# Patient Record
Sex: Female | Born: 1980 | Race: White | Hispanic: No | Smoking: Never smoker
Health system: Southern US, Community
[De-identification: ages and names within clinical notes are randomized; demographics above are authoritative.]

## PROBLEM LIST (undated history)

## (undated) DIAGNOSIS — N200 Calculus of kidney: Secondary | ICD-10-CM

## (undated) DIAGNOSIS — D6851 Activated protein C resistance: Secondary | ICD-10-CM

## (undated) DIAGNOSIS — Z973 Presence of spectacles and contact lenses: Secondary | ICD-10-CM

## (undated) DIAGNOSIS — N201 Calculus of ureter: Secondary | ICD-10-CM

## (undated) DIAGNOSIS — I10 Essential (primary) hypertension: Secondary | ICD-10-CM

## (undated) DIAGNOSIS — J45909 Unspecified asthma, uncomplicated: Secondary | ICD-10-CM

## (undated) DIAGNOSIS — Z87442 Personal history of urinary calculi: Secondary | ICD-10-CM

## (undated) DIAGNOSIS — R319 Hematuria, unspecified: Secondary | ICD-10-CM

## (undated) HISTORY — PX: DILATION AND CURETTAGE OF UTERUS: SHX78

## (undated) HISTORY — DX: Activated protein C resistance: D68.51

## (undated) HISTORY — PX: ESSURE TUBAL LIGATION: SUR464

---
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
## 2000-10-03 ENCOUNTER — Encounter: Payer: Self-pay | Admitting: Emergency Medicine

## 2000-10-03 ENCOUNTER — Emergency Department (HOSPITAL_COMMUNITY): Admission: EM | Admit: 2000-10-03 | Discharge: 2000-10-03 | Payer: Self-pay | Admitting: Emergency Medicine

## 2001-03-10 ENCOUNTER — Emergency Department (HOSPITAL_COMMUNITY): Admission: EM | Admit: 2001-03-10 | Discharge: 2001-03-10 | Payer: Self-pay | Admitting: Emergency Medicine

## 2001-03-10 ENCOUNTER — Observation Stay (HOSPITAL_COMMUNITY): Admission: EM | Admit: 2001-03-10 | Discharge: 2001-03-11 | Payer: Self-pay | Admitting: Emergency Medicine

## 2001-03-10 ENCOUNTER — Encounter: Payer: Self-pay | Admitting: Emergency Medicine

## 2002-04-01 ENCOUNTER — Encounter: Payer: Self-pay | Admitting: Emergency Medicine

## 2002-04-01 ENCOUNTER — Emergency Department (HOSPITAL_COMMUNITY): Admission: EM | Admit: 2002-04-01 | Discharge: 2002-04-01 | Payer: Self-pay | Admitting: Emergency Medicine

## 2004-04-19 ENCOUNTER — Observation Stay (HOSPITAL_COMMUNITY): Admission: EM | Admit: 2004-04-19 | Discharge: 2004-04-20 | Payer: Self-pay | Admitting: Emergency Medicine

## 2004-05-24 ENCOUNTER — Emergency Department (HOSPITAL_COMMUNITY): Admission: EM | Admit: 2004-05-24 | Discharge: 2004-05-24 | Payer: Self-pay | Admitting: Emergency Medicine

## 2004-08-31 ENCOUNTER — Inpatient Hospital Stay (HOSPITAL_COMMUNITY): Admission: AD | Admit: 2004-08-31 | Discharge: 2004-08-31 | Payer: Self-pay | Admitting: Obstetrics and Gynecology

## 2004-09-15 ENCOUNTER — Inpatient Hospital Stay (HOSPITAL_COMMUNITY): Admission: AD | Admit: 2004-09-15 | Discharge: 2004-09-15 | Payer: Self-pay | Admitting: Obstetrics and Gynecology

## 2004-10-13 ENCOUNTER — Inpatient Hospital Stay (HOSPITAL_COMMUNITY): Admission: AD | Admit: 2004-10-13 | Discharge: 2004-10-13 | Payer: Self-pay | Admitting: Obstetrics & Gynecology

## 2004-10-13 ENCOUNTER — Emergency Department (HOSPITAL_COMMUNITY): Admission: EM | Admit: 2004-10-13 | Discharge: 2004-10-13 | Payer: Self-pay | Admitting: Emergency Medicine

## 2004-12-03 ENCOUNTER — Inpatient Hospital Stay (HOSPITAL_COMMUNITY): Admission: AD | Admit: 2004-12-03 | Discharge: 2004-12-03 | Payer: Self-pay | Admitting: Obstetrics and Gynecology

## 2004-12-04 ENCOUNTER — Inpatient Hospital Stay (HOSPITAL_COMMUNITY): Admission: AD | Admit: 2004-12-04 | Discharge: 2004-12-06 | Payer: Self-pay | Admitting: Obstetrics and Gynecology

## 2004-12-27 ENCOUNTER — Inpatient Hospital Stay (HOSPITAL_COMMUNITY): Admission: AD | Admit: 2004-12-27 | Discharge: 2004-12-31 | Payer: Self-pay | Admitting: Obstetrics and Gynecology

## 2005-03-03 ENCOUNTER — Ambulatory Visit (HOSPITAL_COMMUNITY): Admission: AD | Admit: 2005-03-03 | Discharge: 2005-03-03 | Payer: Self-pay | Admitting: Obstetrics and Gynecology

## 2005-04-13 ENCOUNTER — Inpatient Hospital Stay (HOSPITAL_COMMUNITY): Admission: AD | Admit: 2005-04-13 | Discharge: 2005-04-13 | Payer: Self-pay | Admitting: Obstetrics and Gynecology

## 2005-04-16 ENCOUNTER — Inpatient Hospital Stay (HOSPITAL_COMMUNITY): Admission: AD | Admit: 2005-04-16 | Discharge: 2005-04-17 | Payer: Self-pay | Admitting: Obstetrics and Gynecology

## 2005-04-22 ENCOUNTER — Inpatient Hospital Stay (HOSPITAL_COMMUNITY): Admission: AD | Admit: 2005-04-22 | Discharge: 2005-04-24 | Payer: Self-pay | Admitting: Obstetrics and Gynecology

## 2005-06-06 ENCOUNTER — Other Ambulatory Visit: Admission: RE | Admit: 2005-06-06 | Discharge: 2005-06-06 | Payer: Self-pay | Admitting: Obstetrics and Gynecology

## 2005-11-26 ENCOUNTER — Emergency Department (HOSPITAL_COMMUNITY): Admission: EM | Admit: 2005-11-26 | Discharge: 2005-11-26 | Payer: Self-pay | Admitting: Emergency Medicine

## 2007-09-13 ENCOUNTER — Emergency Department (HOSPITAL_COMMUNITY): Admission: EM | Admit: 2007-09-13 | Discharge: 2007-09-13 | Payer: Self-pay | Admitting: Emergency Medicine

## 2008-01-27 ENCOUNTER — Ambulatory Visit (HOSPITAL_COMMUNITY): Admission: RE | Admit: 2008-01-27 | Discharge: 2008-01-27 | Payer: Self-pay | Admitting: Obstetrics and Gynecology

## 2008-02-09 ENCOUNTER — Ambulatory Visit (HOSPITAL_COMMUNITY): Admission: RE | Admit: 2008-02-09 | Discharge: 2008-02-09 | Payer: Self-pay | Admitting: Obstetrics and Gynecology

## 2008-02-09 ENCOUNTER — Encounter (INDEPENDENT_AMBULATORY_CARE_PROVIDER_SITE_OTHER): Payer: Self-pay | Admitting: Obstetrics and Gynecology

## 2008-11-02 ENCOUNTER — Ambulatory Visit: Payer: Self-pay | Admitting: Oncology

## 2008-11-09 LAB — CBC WITH DIFFERENTIAL (CANCER CENTER ONLY)
Eosinophils Absolute: 0.2 10*3/uL (ref 0.0–0.5)
HCT: 36.6 % (ref 34.8–46.6)
LYMPH%: 34.3 % (ref 14.0–48.0)
MCH: 29.4 pg (ref 26.0–34.0)
MCV: 83 fL (ref 81–101)
MONO#: 0.7 10*3/uL (ref 0.1–0.9)
MONO%: 7.4 % (ref 0.0–13.0)
NEUT%: 55 % (ref 39.6–80.0)
Platelets: 296 10*3/uL (ref 145–400)
RBC: 4.39 10*6/uL (ref 3.70–5.32)
WBC: 9.1 10*3/uL (ref 3.9–10.0)

## 2008-11-09 LAB — CMP (CANCER CENTER ONLY)
ALT(SGPT): 24 U/L (ref 10–47)
AST: 18 U/L (ref 11–38)
Albumin: 3.5 g/dL (ref 3.3–5.5)
Alkaline Phosphatase: 75 U/L (ref 26–84)
Potassium: 3.5 mEq/L (ref 3.3–4.7)
Sodium: 139 mEq/L (ref 128–145)
Total Protein: 7.5 g/dL (ref 6.4–8.1)

## 2008-11-15 LAB — HYPERCOAGULABLE PANEL, COMPREHENSIVE RET.
Beta-2 Glyco I IgG: 13 U/mL (ref <20)
Homocysteine: 6.5 umol/L (ref 4.0–15.4)
Protein C Activity: 133 % (ref 75–133)
Protein C, Total: 83 % (ref 70–140)
Protein S Ag, Total: 95 % (ref 70–140)

## 2008-11-17 LAB — CBC WITH DIFFERENTIAL (CANCER CENTER ONLY)
BASO#: 0.1 10*3/uL (ref 0.0–0.2)
BASO%: 0.7 % (ref 0.0–2.0)
EOS%: 1.6 % (ref 0.0–7.0)
Eosinophils Absolute: 0.2 10*3/uL (ref 0.0–0.5)
HCT: 37.9 % (ref 34.8–46.6)
HGB: 13.4 g/dL (ref 11.6–15.9)
LYMPH#: 2.8 10*3/uL (ref 0.9–3.3)
LYMPH%: 28 % (ref 14.0–48.0)
MCH: 29.5 pg (ref 26.0–34.0)
MCHC: 35.3 g/dL (ref 32.0–36.0)
MCV: 83 fL (ref 81–101)
MONO#: 0.7 10*3/uL (ref 0.1–0.9)
MONO%: 6.9 % (ref 0.0–13.0)
NEUT#: 6.3 10*3/uL (ref 1.5–6.5)
NEUT%: 62.8 % (ref 39.6–80.0)
Platelets: 266 10*3/uL (ref 145–400)
RBC: 4.54 10*6/uL (ref 3.70–5.32)
RDW: 13 % (ref 10.5–14.6)
WBC: 10 10*3/uL (ref 3.9–10.0)

## 2008-11-17 LAB — PROTIME-INR (CHCC SATELLITE)
INR: 1 — ABNORMAL LOW (ref 2.0–3.5)
Protime: 12 Seconds (ref 10.6–13.4)

## 2008-11-18 LAB — APTT: aPTT: 29 seconds (ref 24–37)

## 2008-11-25 ENCOUNTER — Inpatient Hospital Stay (HOSPITAL_COMMUNITY): Admission: AD | Admit: 2008-11-25 | Discharge: 2008-11-25 | Payer: Self-pay | Admitting: Obstetrics and Gynecology

## 2008-12-20 ENCOUNTER — Ambulatory Visit: Payer: Self-pay | Admitting: Oncology

## 2008-12-22 LAB — CBC WITH DIFFERENTIAL (CANCER CENTER ONLY)
BASO#: 0.1 10*3/uL (ref 0.0–0.2)
Eosinophils Absolute: 0.2 10*3/uL (ref 0.0–0.5)
HGB: 12.3 g/dL (ref 11.6–15.9)
LYMPH#: 2.3 10*3/uL (ref 0.9–3.3)
MONO#: 0.7 10*3/uL (ref 0.1–0.9)
NEUT#: 5.3 10*3/uL (ref 1.5–6.5)
Platelets: 246 10*3/uL (ref 145–400)
RBC: 4.2 10*6/uL (ref 3.70–5.32)
WBC: 8.5 10*3/uL (ref 3.9–10.0)

## 2009-01-19 ENCOUNTER — Inpatient Hospital Stay (HOSPITAL_COMMUNITY): Admission: AD | Admit: 2009-01-19 | Discharge: 2009-01-20 | Payer: Self-pay | Admitting: Obstetrics and Gynecology

## 2009-03-16 ENCOUNTER — Ambulatory Visit: Payer: Self-pay | Admitting: Oncology

## 2009-03-17 LAB — CBC WITH DIFFERENTIAL (CANCER CENTER ONLY)
BASO%: 0.9 % (ref 0.0–2.0)
EOS%: 1.3 % (ref 0.0–7.0)
HCT: 32.4 % — ABNORMAL LOW (ref 34.8–46.6)
LYMPH#: 2.1 10*3/uL (ref 0.9–3.3)
MCH: 30 pg (ref 26.0–34.0)
MCHC: 34.3 g/dL (ref 32.0–36.0)
MONO%: 5.5 % (ref 0.0–13.0)
NEUT%: 72 % (ref 39.6–80.0)
RDW: 13 % (ref 10.5–14.6)

## 2009-03-17 LAB — PROTIME-INR (CHCC SATELLITE)
INR: 0.9 — ABNORMAL LOW (ref 2.0–3.5)
Protime: 10.8 Seconds (ref 10.6–13.4)

## 2009-03-17 LAB — HEPARIN ANTI-XA: Heparin LMW: <0.1 IU/mL

## 2009-03-22 LAB — URINALYSIS, MICROSCOPIC (CHCC SATELLITE)
Bilirubin (Urine): NEGATIVE
Glucose: NEGATIVE g/dL

## 2009-03-22 LAB — CBC WITH DIFFERENTIAL (CANCER CENTER ONLY)
BASO#: 0.1 10*3/uL (ref 0.0–0.2)
BASO%: 0.7 % (ref 0.0–2.0)
EOS%: 1.8 % (ref 0.0–7.0)
HGB: 10.6 g/dL — ABNORMAL LOW (ref 11.6–15.9)
LYMPH#: 2.4 10*3/uL (ref 0.9–3.3)
MCH: 29.8 pg (ref 26.0–34.0)
MCHC: 34.2 g/dL (ref 32.0–36.0)
MONO%: 5.9 % (ref 0.0–13.0)
NEUT#: 7 10*3/uL — ABNORMAL HIGH (ref 1.5–6.5)
Platelets: 226 10*3/uL (ref 145–400)
RDW: 13.1 % (ref 10.5–14.6)

## 2009-03-22 LAB — IRON AND TIBC: %SAT: 20 % (ref 20–55)

## 2009-03-28 LAB — PROTIME-INR (CHCC SATELLITE)

## 2009-05-01 ENCOUNTER — Inpatient Hospital Stay (HOSPITAL_COMMUNITY): Admission: AD | Admit: 2009-05-01 | Discharge: 2009-05-01 | Payer: Self-pay | Admitting: Obstetrics & Gynecology

## 2009-05-22 ENCOUNTER — Ambulatory Visit: Payer: Self-pay | Admitting: Oncology

## 2009-06-15 ENCOUNTER — Ambulatory Visit: Payer: Self-pay | Admitting: Oncology

## 2009-06-20 LAB — CBC WITH DIFFERENTIAL (CANCER CENTER ONLY)
BASO%: 0.5 % (ref 0.0–2.0)
LYMPH#: 2.1 10*3/uL (ref 0.9–3.3)
LYMPH%: 26.4 % (ref 14.0–48.0)
MCHC: 34 g/dL (ref 32.0–36.0)
MCV: 86 fL (ref 81–101)
MONO#: 0.5 10*3/uL (ref 0.1–0.9)
NEUT%: 65.4 % (ref 39.6–80.0)
RBC: 3.63 10*6/uL — ABNORMAL LOW (ref 3.70–5.32)
RDW: 13.2 % (ref 10.5–14.6)

## 2009-06-20 LAB — HEPARIN ANTI-XA: Heparin LMW: 0.32 IU/mL

## 2009-06-29 ENCOUNTER — Inpatient Hospital Stay (HOSPITAL_COMMUNITY): Admission: AD | Admit: 2009-06-29 | Discharge: 2009-07-02 | Payer: Self-pay | Admitting: Obstetrics

## 2009-07-06 LAB — CBC WITH DIFFERENTIAL (CANCER CENTER ONLY)
BASO%: 0.5 % (ref 0.0–2.0)
EOS%: 2.7 % (ref 0.0–7.0)
LYMPH#: 2 10*3/uL (ref 0.9–3.3)
LYMPH%: 31.7 % (ref 14.0–48.0)
MCV: 86 fL (ref 81–101)
NEUT%: 59.1 % (ref 39.6–80.0)
WBC: 6.3 10*3/uL (ref 3.9–10.0)

## 2009-07-06 LAB — PROTIME-INR (CHCC SATELLITE)
INR: 0.9 — ABNORMAL LOW (ref 2.0–3.5)
Protime: 10.8 Seconds (ref 10.6–13.4)

## 2009-07-10 ENCOUNTER — Ambulatory Visit: Payer: Self-pay | Admitting: Oncology

## 2009-07-10 LAB — CBC WITH DIFFERENTIAL/PLATELET
Basophils Absolute: 0 10*3/uL (ref 0.0–0.1)
Eosinophils Absolute: 0.2 10*3/uL (ref 0.0–0.5)
HGB: 13.4 g/dL (ref 11.6–15.9)
MCH: 28.6 pg (ref 25.1–34.0)
MCHC: 33.3 g/dL (ref 31.5–36.0)
RBC: 4.69 10*6/uL (ref 3.70–5.45)
RDW: 14.2 % (ref 11.2–14.5)
lymph#: 2.4 10*3/uL (ref 0.9–3.3)

## 2009-07-10 LAB — PROTIME-INR
INR: 1.5 — ABNORMAL LOW (ref 2.00–3.50)
Protime: 18 Seconds — ABNORMAL HIGH (ref 10.6–13.4)

## 2009-07-11 ENCOUNTER — Ambulatory Visit: Payer: Self-pay | Admitting: Oncology

## 2009-07-14 LAB — PROTIME-INR (CHCC SATELLITE): INR: 2.7 (ref 2.0–3.5)

## 2009-07-21 LAB — PROTIME-INR (CHCC SATELLITE): INR: 3.5 (ref 2.0–3.5)

## 2009-07-28 LAB — PROTIME-INR (CHCC SATELLITE): Protime: 34.8 Seconds — ABNORMAL HIGH (ref 10.6–13.4)

## 2009-08-03 LAB — CBC WITH DIFFERENTIAL (CANCER CENTER ONLY)
BASO%: 0.8 % (ref 0.0–2.0)
HGB: 13.1 g/dL (ref 11.6–15.9)
LYMPH#: 2.6 10*3/uL (ref 0.9–3.3)
MCH: 28.6 pg (ref 26.0–34.0)
MONO#: 0.4 10*3/uL (ref 0.1–0.9)
NEUT#: 4.4 10*3/uL (ref 1.5–6.5)
NEUT%: 57.3 % (ref 39.6–80.0)
WBC: 7.6 10*3/uL (ref 3.9–10.0)

## 2009-08-03 LAB — BASIC METABOLIC PANEL - CANCER CENTER ONLY
BUN, Bld: 12 mg/dL (ref 7–22)
Chloride: 108 mEq/L (ref 98–108)
Creat: 0.6 mg/dl (ref 0.6–1.2)
Glucose, Bld: 86 mg/dL (ref 73–118)

## 2009-08-03 LAB — PROTIME-INR (CHCC SATELLITE): Protime: 14.4 Seconds — ABNORMAL HIGH (ref 10.6–13.4)

## 2009-09-11 ENCOUNTER — Ambulatory Visit (HOSPITAL_COMMUNITY): Admission: RE | Admit: 2009-09-11 | Discharge: 2009-09-11 | Payer: Self-pay | Admitting: Obstetrics and Gynecology

## 2010-03-02 ENCOUNTER — Ambulatory Visit: Payer: Self-pay | Admitting: Oncology

## 2010-06-03 ENCOUNTER — Encounter: Payer: Self-pay | Admitting: Obstetrics and Gynecology

## 2010-07-31 LAB — BASIC METABOLIC PANEL
BUN: 13 mg/dL (ref 6–23)
CO2: 25 mEq/L (ref 19–32)
Chloride: 104 mEq/L (ref 96–112)
Glucose, Bld: 86 mg/dL (ref 70–99)

## 2010-07-31 LAB — CBC
MCV: 86.9 fL (ref 78.0–100.0)
RBC: 4.61 MIL/uL (ref 3.87–5.11)
RDW: 14.1 % (ref 11.5–15.5)

## 2010-08-01 LAB — CBC
Hemoglobin: 11.1 g/dL — ABNORMAL LOW (ref 12.0–15.0)
MCHC: 33.6 g/dL (ref 30.0–36.0)
RBC: 3.86 MIL/uL — ABNORMAL LOW (ref 3.87–5.11)
RDW: 15.9 % — ABNORMAL HIGH (ref 11.5–15.5)
WBC: 9.8 10*3/uL (ref 4.0–10.5)

## 2010-08-01 LAB — RPR: RPR Ser Ql: NONREACTIVE

## 2010-08-13 LAB — URINALYSIS, ROUTINE W REFLEX MICROSCOPIC
Bilirubin Urine: NEGATIVE
Hgb urine dipstick: NEGATIVE
Ketones, ur: NEGATIVE mg/dL
Protein, ur: NEGATIVE mg/dL
Specific Gravity, Urine: 1.01 (ref 1.005–1.030)
Urobilinogen, UA: 0.2 mg/dL (ref 0.0–1.0)

## 2010-08-13 LAB — URINE MICROSCOPIC-ADD ON: RBC / HPF: NONE SEEN RBC/hpf (ref <3)

## 2010-08-13 LAB — URINE CULTURE: Colony Count: NO GROWTH

## 2010-08-17 LAB — URINALYSIS, ROUTINE W REFLEX MICROSCOPIC
Glucose, UA: NEGATIVE mg/dL
Hgb urine dipstick: NEGATIVE
Protein, ur: NEGATIVE mg/dL
pH: 6.5 (ref 5.0–8.0)

## 2010-08-19 LAB — CBC
Hemoglobin: 12.2 g/dL (ref 12.0–15.0)
MCV: 87.6 fL (ref 78.0–100.0)
Platelets: 232 10*3/uL (ref 150–400)
RBC: 4.06 MIL/uL (ref 3.87–5.11)
RDW: 13.4 % (ref 11.5–15.5)
WBC: 10.2 10*3/uL (ref 4.0–10.5)

## 2010-08-19 LAB — PROTIME-INR: Prothrombin Time: 13.9 seconds (ref 11.6–15.2)

## 2010-09-05 ENCOUNTER — Other Ambulatory Visit: Payer: Self-pay | Admitting: Emergency Medicine

## 2010-09-05 DIAGNOSIS — E079 Disorder of thyroid, unspecified: Secondary | ICD-10-CM

## 2010-09-25 NOTE — Op Note (Signed)
NAMESHALICE, WOODRING               ACCOUNT NO.:  [id number]   MEDICAL RECORD NO.:  [id number]          PATIENT TYPE:  AMB   LOCATION:  SDC                           [facility]ILITY:  [facility]   PHYSICIAN:  Carrington Clamp, M.D. DATE OF BIRTH:  [birth year]   DATE OF PROCEDURE:  02/09/2008  DATE OF DISCHARGE:                               OPERATIVE REPORT   PREOPERATIVE DIAGNOSIS:  Missed abortion.   POSTOPERATIVE DIAGNOSIS:  Missed abortion.   PROCEDURE:  Dilation and evacuation.   SURGEON:  Carrington Clamp, MD   ANESTHESIA:  LMA   FINDINGS:  8 weeks' size uterus down to 6-week size post procedure with  good CRIE.   SPECIMENS:  Products of conception, uterine contents.   DISPOSITION:  To Pathology and for genetic studies.   ESTIMATED BLOOD LOSS:  50 mL.   IV FLUIDS:  1000 mL.   URINE OUTPUT:  Not measured.   COMPLICATIONS:  None.   MEDICATIONS:  Methergine.   COUNTS:  Correct x3.   TECHNIQUE:  After adequate general anesthesia was achieved, the patient  was prepped and draped in usual sterile fashion in the dorsal lithotomy  position.  Bladder was emptied with a red rubber catheter.  Speculum  placed in the vagina.  The cervix was grasped with tenaculum and then  dilated up with Shawnie Pons dilators.  A 10-mm suction curette was used to  evacuate the uterus and alternating the suction and sharp curettage was  performed until good CRIE was identified.  The products of conception  were sent to Pathology and for genetic studies as there had been an  abnormality of the baby's lungs with fluid in both lungs on her early  ultrasound at 8 weeks (about 3 weeks earlier from today).  All the  instruments were then withdrawn from the vagina and the patient was  given a dose of Methergine.  The patient tolerated the procedure well.  She returned to recovery room in stable condition.      Carrington Clamp, M.D.  Electronically Signed     MH/MEDQ  D:  02/09/2008  T:  02/10/2008  Job:   [id number]

## 2010-09-28 NOTE — Discharge Summary (Signed)
[name], [name]               ACCOUNT NO.:  [id number]   MEDICAL RECORD NO.:  [id number]          PATIENT TYPE:  INP   LOCATION:  9153                          [facility]ILITY:  [facility]   PHYSICIAN:  Randye Lobo, M.D.   DATE OF BIRTH:  [birth year]   DATE OF ADMISSION:  DATE OF DISCHARGE:  12/31/2004                                 DISCHARGE SUMMARY   ADMISSION DIAGNOSES:  1.  Intrauterine gestation at 22-6/7 weeks.  2.  Severe back pain.   DISCHARGE DIAGNOSES:  1.  Intrauterine gestation at 23-3/7, undelivered.  2.  Escherichia coli pyelonephritis, resolving.  3.  Hypokalemia, resolving.  4.  Anemia.   ADMISSION HISTORY AND PHYSICAL EXAMINATION:  The patient is a 30 year old  gravida 3, para 1, 0, 1, 1 female at 22-6/[redacted] weeks gestation who presented  with a complaint of severe back pain, vomiting, and leakage of fluid per  vagina.   The patient's antepartum course was significant for nausea and vomiting of  pregnancy. She also had a history of hypertension but was not currently  being treated with an antihypertensive medication. The patient also had a  history of gastroesophageal reflux disease. Of note, 3 weeks prior to the  current admission, the patient was treated for E-coli pyelonephritis  sensitive to Ancef.   On physical examination the patient was noted to have a temperature of  102.5, blood pressure 132/61. Back slight left CVA tenderness. Abdomen was  soft and nontender. Sterile speculum examination demonstrated no ferning and  the cervix was noted to be closed. Fetal heart rate tones were in the 190s  and the tocometer demonstrated no contractions. Admission, potassium 3. WBC  13,800, hematocrit 31.3%. Amylase 36, lipase 23.   Admission OB ultrasound: Estimated fetal weight 628 grams. Transverse lie.  AFI of 13.4. Cervix 5.5 cm of length.   The patient was given a diagnosis of probable pyelonephritis. A urinalysis  and culture was performed and the patient was begun  on Ancef 2 grams IV q.6  h. The patient was also prescribed Pepcid and Phenergan for her  gastroesophageal reflux disease.   HOSPITAL COURSE:  The patient's antibiotic regimen was expanded to include  gentamycin and clindamycin early in the morning on December 28, 2004.  Her  white blood cell count that morning was 15.7 thousand. The patient received  Stadol for pain and Tylenol for fever. She was given potassium chloride  intravenously to treat the hypokalemia. By December 30, 2004 the final culture  report demonstrated E. Coli 40,000 colonies per ml. The E. coli was  sensitive to cefazolin and gentamycin. The gentamicin was discontinued at  that time. Her white blood cell count was down to 8.9 thousand at the time  that the antibiotic order change was made.  Her hemoglobin was noted to be  8.4 on December 30, 2004.   CONDITION ON DISCHARGE:  By December 31, 2004 the patient's pain had resolved.  She had no more fevers. She was feeling well and ready for discharge.   DISPOSITION:  The patient is discharged to home in improved condition.  DISCHARGE MEDICATIONS:  1.  Keflex 500 mg p.o. q.i.d. times 7 days.  2.  After she completes the course of Keflex she will be switched to      Macrobid 100 mg p.o. q.h.s. for suppression of urinary tract infection      throughout the remainder of her pregnancy.  3.  Prenatal vitamins one p.o. daily.  4.  Iron sulfate 325 mg p.o. t.i.d.   FOLLOW UP:  The patient will follow up in the office in the office in 8  days.  The patient will call the office if she experiences fever, increased  pain, and nausea and vomiting, abdominal cramping or contractions, leakage  of fluid per vagina, decreased fetal movement, or any other concern.      Randye Lobo, M.D.  Electronically Signed     BES/MEDQ  D:  02/02/2005  T:  02/03/2005  Job:  [id number]

## 2010-09-28 NOTE — H&P (Signed)
NAMESHEWANDA, [name]               ACCOUNT NO.:  [id number]   MEDICAL RECORD NO.:  [id number]          PATIENT TYPE:  OIB   LOCATION:  A415                          [facility]ACILITY:  [facility]   PHYSICIAN:  Tilda Burrow, M.D. DATE OF BIRTH:  [birth year]   DATE OF ADMISSION:  03/03/2005  DATE OF DISCHARGE:  LH                                HISTORY & PHYSICAL   CHIEF COMPLAINT:  Vaginal bleeding at 32 weeks.   HISTORY OF PRESENT ILLNESS:  This 30 year old female, G3, P1, AB 1, one  living child, presents now at 41 weeks' gestation followed at [facility] with a pregnancy that has been notable for urinary tract infections  requiring chronic Macrodantin suppression at this time and early first-  trimester admission x3 for dehydration and vomiting complaints.  She is  evaluated at [facility] after presenting with episode of blood  noted in the toilet when she went to void.  She has sensation of some fluid  loss per vagina and upon check of the toilet bowl, she was found to have  significant amount of redness in the toilet bowl.  She has not had any  recent urinary frequency.  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   Monitor shows only occasional mild contraction consistent with Braxton-Hicks  contractions.  She is monitored for greater than an hour and only two  contractions noted documented on monitoring.  Fetal heart rate is extremely  reactive.  Limited ultrasound is performed by Dr. Emelda Fear revealing a  singleton BREECH infant, back down with generous fluid volume, anterior  fundal placenta with clear placental margins.  There is no evidence of  previa.  There is no placental bleeding appreciated.  The patient complains  of left-sided tenderness, but on ultrasound this appears to be due to the  positioning of the fetal feet and absence of placenta in that area.  She is  probably feeling the kicks more than anticipated.  Speculum  exam shows  physiologic secretions negative for blood.  Cervix is posterior, long and  high.   IMPRESSION:  Urinalysis is pending at this time.  No pregnancy-related  source of bleeding.  Await urinalysis.  At this time we have presumptive  diagnosis of bleeding from the possible subclinical urinary tract infection.   PLAN:  Will continue empiric Macrodantin therapy.  Send urine culture and  follow up through [facility] with results.      Tilda Burrow, M.D.  Electronically Signed    JVF/MEDQ  D:  03/03/2005  T:  03/04/2005  Job:  [id number]   cc:   [facility]   [facility]

## 2010-09-28 NOTE — Consult Note (Signed)
[name], [name]               ACCOUNT NO.:  [id number]   MEDICAL RECORD NO.:  [id number]          PATIENT TYPE:  OBV   LOCATION:  A418                          [facility]   PHYSICIAN:  Tilda Burrow, M.D. DATE OF BIRTH:  1980-11-25   DATE OF CONSULTATION:  DATE OF DISCHARGE:                                   CONSULTATION   CHIEF COMPLAINT:  Early pregnancy bleeding times two days.   HISTORY OF PRESENT ILLNESS:  This 30 year old, gravida 2, para 1, LMP  March 13, 2004, with diagnosis of pregnancy one week ago with home  pregnancy test confirmed with quantitative HCG one week ago at office of Dr.  Ralph Dowdy of [facility] presents with three days ago becoming mucoid vaginal discharge  followed by two days of light intermittent bleeding and dramatically  increased cramping this evening.  She presents to [facility] Emergency Room  with some subjective sense of dizziness but minimal blood loss.  She  presents with hemoglobin 13, hematocrit 40, quantitative HCG dropping to 8.  Blood type is Rh+.   PHYSICAL EXAMINATION:  GENERAL:  Normal-appearing, healthy, Caucasian female  in mild to moderate discomfort only, tolerating intermittent cramping  poorly.  Pain is central, mid-line with minimal radiation into left lower  quadrant.  GENITOURINARY:  External genitalia normal.  Vaginal exam shows light blood.  Prior exam by Dr. Weldon Inches, ER physician, indicates that a couple of small  clots were noted when he first examined her.  Uterus is anterior, upper  limits of normal size, 2+ tender.  Adnexa are specifically nontender on the  left or right.   IMPRESSION:  Missed AB.  Inevitable pregnancy loss.   PLAN:  Overnight observation for pain management.  We will give oral Cytotec  to increase cramping and increase the incidents of spontaneous expulsion of  POCs.  Probable brief overnight stay and home in a.m.     John   JVF/MEDQ  D:  04/19/2004  T:  04/20/2004  Job:  [id number]   cc:    Tilda Burrow, M.D.  [address] [place]  [place] [address]  Fax: [phone]   Ernestina Penna  [address]  [place]  [place] [address]  Fax: [phone]

## 2010-10-01 ENCOUNTER — Ambulatory Visit
Admission: RE | Admit: 2010-10-01 | Discharge: 2010-10-01 | Disposition: A | Discharge status: Home / Self Care | Payer: BC Managed Care – PPO | Source: Ambulatory Visit | Attending: Emergency Medicine | Admitting: Emergency Medicine

## 2010-10-01 DIAGNOSIS — E079 Disorder of thyroid, unspecified: Secondary | ICD-10-CM

## 2011-02-06 LAB — DIFFERENTIAL
Basophils Absolute: 0
Basophils Relative: 0
Eosinophils Absolute: 0.1
Monocytes Relative: 8
Neutro Abs: 6.6
Neutrophils Relative %: 71

## 2011-02-06 LAB — BASIC METABOLIC PANEL
BUN: 15
CO2: 28
Calcium: 9.5
Chloride: 105
Creatinine, Ser: 0.97
Glucose, Bld: 106 — ABNORMAL HIGH

## 2011-02-06 LAB — CBC
MCHC: 34.9
MCV: 84.8
Platelets: 236
RDW: 13

## 2011-02-09 ENCOUNTER — Emergency Department (HOSPITAL_COMMUNITY)
Admission: EM | Admit: 2011-02-09 | Discharge: 2011-02-09 | Disposition: A | Discharge status: Home / Self Care | Payer: BC Managed Care – PPO | Attending: Emergency Medicine | Admitting: Emergency Medicine

## 2011-02-09 ENCOUNTER — Emergency Department (HOSPITAL_COMMUNITY): Payer: BC Managed Care – PPO

## 2011-02-09 DIAGNOSIS — N898 Other specified noninflammatory disorders of vagina: Secondary | ICD-10-CM | POA: Insufficient documentation

## 2011-02-09 DIAGNOSIS — I10 Essential (primary) hypertension: Secondary | ICD-10-CM | POA: Insufficient documentation

## 2011-02-09 DIAGNOSIS — N390 Urinary tract infection, site not specified: Secondary | ICD-10-CM | POA: Insufficient documentation

## 2011-02-09 DIAGNOSIS — N201 Calculus of ureter: Secondary | ICD-10-CM | POA: Insufficient documentation

## 2011-02-09 DIAGNOSIS — R109 Unspecified abdominal pain: Secondary | ICD-10-CM | POA: Insufficient documentation

## 2011-02-09 DIAGNOSIS — D682 Hereditary deficiency of other clotting factors: Secondary | ICD-10-CM | POA: Insufficient documentation

## 2011-02-09 LAB — BASIC METABOLIC PANEL
BUN: 12 mg/dL (ref 6–23)
GFR calc Af Amer: >60 mL/min (ref >60)
GFR calc non Af Amer: >60 mL/min (ref >60)
Potassium: 3.4 mEq/L — ABNORMAL LOW (ref 3.5–5.1)
Sodium: 136 mEq/L (ref 135–145)

## 2011-02-09 LAB — CBC
Hemoglobin: 15.1 g/dL — ABNORMAL HIGH (ref 12.0–15.0)
RBC: 5.21 MIL/uL — ABNORMAL HIGH (ref 3.87–5.11)

## 2011-02-09 LAB — WET PREP, GENITAL
Clue Cells Wet Prep HPF POC: NONE SEEN
Yeast Wet Prep HPF POC: NONE SEEN

## 2011-02-09 LAB — URINE MICROSCOPIC-ADD ON

## 2011-02-09 LAB — URINALYSIS, ROUTINE W REFLEX MICROSCOPIC
Bilirubin Urine: NEGATIVE
Glucose, UA: NEGATIVE mg/dL
Protein, ur: NEGATIVE mg/dL

## 2011-02-09 LAB — DIFFERENTIAL
Basophils Absolute: 0 10*3/uL (ref 0.0–0.1)
Basophils Relative: 0 % (ref 0–1)
Monocytes Relative: 4 % (ref 3–12)
Neutro Abs: 17.9 10*3/uL — ABNORMAL HIGH (ref 1.7–7.7)
Neutrophils Relative %: 92 % — ABNORMAL HIGH (ref 43–77)

## 2011-02-11 LAB — URINE CULTURE: Culture  Setup Time: 201209301127

## 2011-02-11 LAB — URINALYSIS, ROUTINE W REFLEX MICROSCOPIC
Bilirubin Urine: NEGATIVE
Glucose, UA: NEGATIVE
Hgb urine dipstick: NEGATIVE
Ketones, ur: NEGATIVE
Protein, ur: 30 — AB
Urobilinogen, UA: 0.2

## 2011-02-11 LAB — GC/CHLAMYDIA PROBE AMP, GENITAL
Chlamydia, DNA Probe: NEGATIVE
GC Probe Amp, Genital: NEGATIVE

## 2011-02-11 LAB — CBC
HCT: 42
Hemoglobin: 14.2
MCHC: 33.8
MCV: 87.3
Platelets: 251
RDW: 13

## 2011-02-11 LAB — URINE MICROSCOPIC-ADD ON

## 2011-02-15 NOTE — Consult Note (Signed)
  NAMEMILIA, [name]               ACCOUNT NO.:  [id number]  MEDICAL RECORD NO.:  [id number]  LOCATION:  WLED                         FACILITY:  [facility]  PHYSICIAN:  Elford Evilsizer I. 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  The patient feels feverish, although she has not taken her temperature.  She is evaluated in the [facility] Emergency Room with CT scan showing a 7 mm left upper ureteral calculus with no hydronephrosis.  In addition, the patient had a gallbladder with some  gallstones and splenomegaly. Otherwise, she has laboratories which showed creatinine of 0.79.  Her urinalysis showing mildly positive leukocyte esterase.  PAST MEDICAL HISTORY:  __________.  SOCIAL HISTORY:  The patient drinks no sodas.  She does drink 2-3 eight ounce cups of tea per week; the patient is married and lives with her husband and children.  Tobacco, none.  Alcohol, none.  PHYSICAL EXAMINATION:  GENERAL:  She is a well-developed, well-nourished white female in on acute distress. VITAL SIGNS:  Blood pressure is 119/68, pulse of 81, respiratory rate 19, and temperature 99. NECK:  Supple, nontender.  No nodes. CHEST:  Clear to P and A. ABDOMEN:  Soft.  Decreased bowel sounds without organomegaly or masses. There is some CVA discomfort to palpation on the left side. GYN:  Normal BUS.  IMPRESSION AND PLAN:  A 7-mm upper left ureteral calculus.  I have given the patient with 2 options, including discharge with Percocet and trimethoprim coverage or return to clinic for lithotripsy schedule or to be admitted, taken to the operating room for double J stent tomorrow morning, followed by __________, and does not  think she is particularly feverish, and flank pain is well controlled in the emergency room.  She would like to be discharged.  We discharged her on Percocet and trimethoprim.  She will return to the office for scheduling for lithotripsy.     [name] I. Patsi Sears, M.D.     SIT/MEDQ  D:  02/09/2011  T:  02/10/2011  Job:  [id number]  cc:   Dr. [facility]  Electronically Signed by Jethro Bolus M.D. on 02/15/2011 12:53:15 PM

## 2011-02-18 ENCOUNTER — Ambulatory Visit (HOSPITAL_COMMUNITY)
Admission: RE | Admit: 2011-02-18 | Discharge: 2011-02-18 | Disposition: A | Discharge status: Home / Self Care | Payer: BC Managed Care – PPO | Source: Ambulatory Visit | Attending: Urology | Admitting: Urology

## 2011-02-18 DIAGNOSIS — N201 Calculus of ureter: Secondary | ICD-10-CM | POA: Insufficient documentation

## 2011-02-18 DIAGNOSIS — I10 Essential (primary) hypertension: Secondary | ICD-10-CM | POA: Insufficient documentation

## 2011-02-18 HISTORY — PX: EXTRACORPOREAL SHOCK WAVE LITHOTRIPSY: SHX1557

## 2011-04-01 ENCOUNTER — Other Ambulatory Visit: Payer: Self-pay | Admitting: Urology

## 2011-04-03 ENCOUNTER — Encounter (HOSPITAL_COMMUNITY): Payer: Self-pay

## 2011-04-03 ENCOUNTER — Encounter (HOSPITAL_COMMUNITY)
Admission: RE | Admit: 2011-04-03 | Discharge: 2011-04-03 | Disposition: A | Discharge status: Home / Self Care | Payer: BC Managed Care – PPO | Source: Ambulatory Visit | Attending: Urology | Admitting: Urology

## 2011-04-03 ENCOUNTER — Other Ambulatory Visit: Payer: Self-pay

## 2011-04-03 ENCOUNTER — Ambulatory Visit (HOSPITAL_COMMUNITY)
Admission: RE | Admit: 2011-04-03 | Discharge: 2011-04-03 | Disposition: A | Discharge status: Home / Self Care | Payer: BC Managed Care – PPO | Source: Ambulatory Visit | Attending: Urology | Admitting: Urology

## 2011-04-03 DIAGNOSIS — N200 Calculus of kidney: Secondary | ICD-10-CM | POA: Insufficient documentation

## 2011-04-03 DIAGNOSIS — I10 Essential (primary) hypertension: Secondary | ICD-10-CM | POA: Insufficient documentation

## 2011-04-03 DIAGNOSIS — Z01810 Encounter for preprocedural cardiovascular examination: Secondary | ICD-10-CM | POA: Insufficient documentation

## 2011-04-03 DIAGNOSIS — Z01812 Encounter for preprocedural laboratory examination: Secondary | ICD-10-CM | POA: Insufficient documentation

## 2011-04-03 HISTORY — DX: Essential (primary) hypertension: I10

## 2011-04-03 LAB — SURGICAL PCR SCREEN
MRSA, PCR: NEGATIVE
Staphylococcus aureus: NEGATIVE

## 2011-04-03 LAB — BASIC METABOLIC PANEL
BUN: 13 mg/dL (ref 6–23)
Calcium: 9.3 mg/dL (ref 8.4–10.5)
GFR calc Af Amer: >90 mL/min (ref >90)
GFR calc non Af Amer: >90 mL/min (ref >90)
Glucose, Bld: 86 mg/dL (ref 70–99)
Potassium: 3.5 mEq/L (ref 3.5–5.1)
Sodium: 136 mEq/L (ref 135–145)

## 2011-04-03 LAB — CBC
Hemoglobin: 13.3 g/dL (ref 12.0–15.0)
MCH: 28.2 pg (ref 26.0–34.0)
MCHC: 33.3 g/dL (ref 30.0–36.0)
RDW: 14 % (ref 11.5–15.5)

## 2011-04-03 NOTE — Patient Instructions (Addendum)
[name]  04/03/2011   Your procedure is scheduled on:  04/05/11 0900-1030 am   Report to [facility] at 0700 AM.  Call this number if you have problems the morning of surgery: [phone]   Remember:   Do not eat food:After Midnight.  Do not drink clear liquids: After Midnight.  Take these medicines the morning of surgery with A SIP OF WATER: Omeprazole    Do not wear jewelry, make-up or nail polish.  Do not wear lotions, powders, or perfumes.  Do not shave 48 hours prior to surgery.  Do not bring valuables to the hospital.  Contacts, dentures or bridgework may not be worn into surgery.  Leave suitcase in the car. After surgery it may be brought to your room.  For patients admitted to the hospital, checkout time is 11:00 AM the day of discharge.   Patients discharged the day of surgery will not be allowed to drive home.  Name and phone number of your driver:   Special Instructions: CHG Shower Use Special Wash: 1/2 bottle night before surgery and 1/2 bottle morning of surgery.   Please read over the following fact sheets that you were given: MRSA Information

## 2011-04-05 ENCOUNTER — Ambulatory Visit (HOSPITAL_COMMUNITY)
Admission: RE | Admit: 2011-04-05 | Discharge: 2011-04-05 | Disposition: A | Discharge status: Home / Self Care | Payer: BC Managed Care – PPO | Source: Ambulatory Visit | Attending: Urology | Admitting: Urology

## 2011-04-05 ENCOUNTER — Ambulatory Visit (HOSPITAL_COMMUNITY): Payer: BC Managed Care – PPO | Admitting: Anesthesiology

## 2011-04-05 ENCOUNTER — Encounter (HOSPITAL_COMMUNITY): Payer: Self-pay | Admitting: *Deleted

## 2011-04-05 ENCOUNTER — Encounter (HOSPITAL_COMMUNITY): Payer: Self-pay | Admitting: Anesthesiology

## 2011-04-05 ENCOUNTER — Encounter (HOSPITAL_COMMUNITY)
Admission: RE | Disposition: A | Discharge status: Home / Self Care | Payer: Self-pay | Source: Ambulatory Visit | Attending: Urology

## 2011-04-05 ENCOUNTER — Encounter (HOSPITAL_COMMUNITY): Payer: Self-pay

## 2011-04-05 DIAGNOSIS — N201 Calculus of ureter: Secondary | ICD-10-CM | POA: Insufficient documentation

## 2011-04-05 DIAGNOSIS — K219 Gastro-esophageal reflux disease without esophagitis: Secondary | ICD-10-CM | POA: Insufficient documentation

## 2011-04-05 DIAGNOSIS — Z79899 Other long term (current) drug therapy: Secondary | ICD-10-CM | POA: Insufficient documentation

## 2011-04-05 DIAGNOSIS — R109 Unspecified abdominal pain: Secondary | ICD-10-CM | POA: Insufficient documentation

## 2011-04-05 DIAGNOSIS — N189 Chronic kidney disease, unspecified: Secondary | ICD-10-CM | POA: Insufficient documentation

## 2011-04-05 DIAGNOSIS — I129 Hypertensive chronic kidney disease with stage 1 through stage 4 chronic kidney disease, or unspecified chronic kidney disease: Secondary | ICD-10-CM | POA: Insufficient documentation

## 2011-04-05 DIAGNOSIS — N200 Calculus of kidney: Secondary | ICD-10-CM

## 2011-04-05 HISTORY — PX: CYSTOSCOPY W/ URETERAL STENT PLACEMENT: SHX1429

## 2011-04-05 HISTORY — PX: CYSTOSCOPY/RETROGRADE/URETEROSCOPY/STONE EXTRACTION WITH BASKET: SHX5317

## 2011-04-05 SURGERY — CYSTOSCOPY, WITH CALCULUS REMOVAL USING BASKET
Anesthesia: General | Site: Ureter | Laterality: Left | Wound class: Clean Contaminated

## 2011-04-05 MED ORDER — DIPHENHYDRAMINE HCL 50 MG/ML IJ SOLN
INTRAMUSCULAR | Status: AC
  Filled 2011-04-05: qty 1

## 2011-04-05 MED ORDER — IOHEXOL 300 MG/ML  SOLN
INTRAMUSCULAR | Status: AC
  Filled 2011-04-05: qty 1

## 2011-04-05 MED ORDER — LACTATED RINGERS IV SOLN: INTRAVENOUS | Status: DC

## 2011-04-05 MED ORDER — MIDAZOLAM HCL 5 MG/5ML IJ SOLN
INTRAMUSCULAR | Status: DC | PRN
  Administered 2011-04-05: 2 mg via INTRAVENOUS

## 2011-04-05 MED ORDER — FENTANYL CITRATE 0.05 MG/ML IJ SOLN
INTRAMUSCULAR | Status: AC
  Filled 2011-04-05: qty 2

## 2011-04-05 MED ORDER — ACETAMINOPHEN 10 MG/ML IV SOLN
INTRAVENOUS | Status: AC
  Filled 2011-04-05: qty 100

## 2011-04-05 MED ORDER — PROMETHAZINE HCL 25 MG/ML IJ SOLN: 6.2500 mg | INTRAMUSCULAR | Status: DC | PRN

## 2011-04-05 MED ORDER — HYDROCODONE-ACETAMINOPHEN 10-325 MG PO TABS: 1.0000 | ORAL_TABLET | Freq: Four times a day (QID) | ORAL | Status: DC | PRN

## 2011-04-05 MED ORDER — CIPROFLOXACIN IN D5W 400 MG/200ML IV SOLN
400.0000 mg | INTRAVENOUS | Status: AC
  Administered 2011-04-05: 400 mg via INTRAVENOUS

## 2011-04-05 MED ORDER — PROPOFOL 10 MG/ML IV EMUL
INTRAVENOUS | Status: DC | PRN
  Administered 2011-04-05: 150 mg via INTRAVENOUS

## 2011-04-05 MED ORDER — BELLADONNA ALKALOIDS-OPIUM 16.2-60 MG RE SUPP
RECTAL | Status: AC
  Filled 2011-04-05: qty 1

## 2011-04-05 MED ORDER — ONDANSETRON HCL 4 MG/2ML IJ SOLN
INTRAMUSCULAR | Status: DC | PRN
  Administered 2011-04-05: 4 mg via INTRAVENOUS

## 2011-04-05 MED ORDER — LACTATED RINGERS IV SOLN
INTRAVENOUS | Status: DC | PRN
  Administered 2011-04-05: 08:00:00 via INTRAVENOUS

## 2011-04-05 MED ORDER — CIPROFLOXACIN IN D5W 400 MG/200ML IV SOLN
INTRAVENOUS | Status: AC
  Filled 2011-04-05: qty 200

## 2011-04-05 MED ORDER — BELLADONNA ALKALOIDS-OPIUM 16.2-60 MG RE SUPP
RECTAL | Status: DC | PRN
  Administered 2011-04-05: 1 via RECTAL

## 2011-04-05 MED ORDER — SODIUM CHLORIDE 0.9 % IR SOLN
Status: DC | PRN
  Administered 2011-04-05: 3000 mL

## 2011-04-05 MED ORDER — FENTANYL CITRATE 0.05 MG/ML IJ SOLN: 25.0000 ug | INTRAMUSCULAR | Status: DC | PRN

## 2011-04-05 MED ORDER — DIPHENHYDRAMINE HCL 50 MG/ML IJ SOLN
12.5000 mg | Freq: Two times a day (BID) | INTRAMUSCULAR | Status: AC | PRN
  Administered 2011-04-05 (×2): 12.5 mg via INTRAVENOUS

## 2011-04-05 MED ORDER — HYDROMORPHONE HCL PF 1 MG/ML IJ SOLN
0.2500 mg | INTRAMUSCULAR | Status: DC | PRN
  Administered 2011-04-05: 0.5 mg via INTRAVENOUS

## 2011-04-05 MED ORDER — FENTANYL CITRATE 0.05 MG/ML IJ SOLN
INTRAMUSCULAR | Status: DC | PRN
  Administered 2011-04-05 (×3): 50 ug via INTRAVENOUS
  Administered 2011-04-05: 100 ug via INTRAVENOUS

## 2011-04-05 MED ORDER — ACETAMINOPHEN 10 MG/ML IV SOLN
INTRAVENOUS | Status: DC | PRN
  Administered 2011-04-05: 1000 mg via INTRAVENOUS

## 2011-04-05 MED ORDER — HYDROMORPHONE HCL PF 1 MG/ML IJ SOLN
INTRAMUSCULAR | Status: AC
  Filled 2011-04-05: qty 1

## 2011-04-05 MED ORDER — KETOROLAC TROMETHAMINE 30 MG/ML IJ SOLN
INTRAMUSCULAR | Status: DC | PRN
  Administered 2011-04-05: 30 mg via INTRAVENOUS

## 2011-04-05 SURGICAL SUPPLY — 25 items
ADAPTER CATH URET PLST 4-6FR (CATHETERS) ×3 IMPLANT
ADPR CATH URET STRL DISP 4-6FR (CATHETERS) ×2
BAG URO CATCHER STRL LF (DRAPE) ×3 IMPLANT
BASKET STNLS GEMINI 4WIRE 3FR (BASKET) ×1 IMPLANT
BASKET ZERO TIP NITINOL 2.4FR (BASKET) ×1 IMPLANT
BSKT STON RTRVL GEM 120X11 3FR (BASKET) ×2
BSKT STON RTRVL ZERO TP 2.4FR (BASKET) ×2
CATH INTERMIT  6FR 70CM (CATHETERS) ×3 IMPLANT
CATH URET 5FR 28IN OPEN ENDED (CATHETERS) ×2 IMPLANT
CLOTH BEACON ORANGE TIMEOUT ST (SAFETY) ×3 IMPLANT
DRAPE CAMERA CLOSED 9X96 (DRAPES) ×3 IMPLANT
GLOVE BIOGEL M STRL SZ7.5 (GLOVE) ×3 IMPLANT
GLOVE SURG SS PI 8.0 STRL IVOR (GLOVE) ×2 IMPLANT
GOWN PREVENTION PLUS XLARGE (GOWN DISPOSABLE) ×2 IMPLANT
GOWN STRL NON-REIN LRG LVL3 (GOWN DISPOSABLE) ×4 IMPLANT
GOWN STRL REIN XL XLG (GOWN DISPOSABLE) ×3 IMPLANT
GUIDEWIRE STR DUAL SENSOR (WIRE) ×3 IMPLANT
LASER FIBER DISP (UROLOGICAL SUPPLIES) ×1 IMPLANT
MANIFOLD NEPTUNE II (INSTRUMENTS) ×3 IMPLANT
MARKER SKIN DUAL TIP RULER LAB (MISCELLANEOUS) ×3 IMPLANT
NS IRRIG 1000ML POUR BTL (IV SOLUTION) ×3 IMPLANT
PACK CYSTO (CUSTOM PROCEDURE TRAY) ×3 IMPLANT
SCRUB PCMX 4 OZ (MISCELLANEOUS) ×3 IMPLANT
STENT CONTOUR 6FRX24X.038 (STENTS) ×1 IMPLANT
TUBING CONNECTING 10 (TUBING) ×3 IMPLANT

## 2011-04-05 NOTE — Transfer of Care (Signed)
Immediate Anesthesia Transfer of Care Note  Patient: [name]  Procedure(s) Performed:  CYSTOSCOPY/RETROGRADE/URETEROSCOPY/STONE EXTRACTION WITH BASKET -  cysto, left retrograde, left ureteroscopy, left ureteral stone basketry, placement of double j stent left ureter  ; HOLMIUM LASER APPLICATION; CYSTOSCOPY WITH STENT REPLACEMENT  Patient Location: PACU  Anesthesia Type: General  Level of Consciousness: awake, alert , oriented and patient cooperative  Airway & Oxygen Therapy: Patient Spontanous Breathing and Patient connected to face mask oxygen  Post-op Assessment: Report given to PACU RN and Post -op Vital signs reviewed and stable  Post vital signs: Reviewed and stable  Complications: No apparent anesthesia complications

## 2011-04-05 NOTE — Op Note (Signed)
Pre-Op Diagnosis:   Retained L lower Ureteral stone  Post-op Dx: Same  OPERATION: Cystoscopy, Left retrograde pyelogram, ureteroscopy, laser fragmentation of stone, basket extraction L lower ureteral stone, Left JJ stent ( (46F x 24cm).  Surgeon: Sanaai Doane  Anesthesia: Gen LMA  Preparation: After appropriate pre-anesthesia, the pt was brought into the operating room, and placed upon the operating table, where she underwent general LMA anesthesia. Time-outs observed, and the pt's operative side was appropriately marked.   Patient' History was reviewed:  30 yo female post ESWL for L upper ureteral stone, now with fragment in the LL ureter causing pain in the L flank, and LLQ. Now for laser fragmentation,and removal.   Chance of success: Excellent.     Cystoscopy was accomplished, showing normal urethra, bladder base, and ureteral orifices. No bladder stone, tumor, or diverticula are noted.     Left retrograde pyelogram was accomplished, showing the stone in the left lower ureter, with proximal hydronephrosis. A ).038 guidewire was passed around the stone into the renal pelvis-identified with contrast. Ureterscopy was accomplished, and the stone identified and photographed, and the laser was used to fragment the stone, and the pieces were removed. Lower ureter edema and some bleeding was noted, and it was elected to place  JJ stent. This was accomplished over the guide wire, and coiled in the kidney and bladder under fluoroscopic control.  The patient was given IV toradol and previously received IV tylenol and antibiotic.     She was awakened and taken to the recovery room in good condition.

## 2011-04-05 NOTE — Discharge Summary (Signed)
Physician Discharge Summary  Patient ID: UBAH RADKE MRN: [id number] DOB/AGE: 31/27/1982 31 y.o.  Admit date: 04/05/2011 Discharge date: 04/05/2011  Admission Diagnoses:  Discharge Diagnoses:  Active Problems:  * No active hospital problems. *    Discharged Condition: good  Hospital Course:  Surgery 11/ 23/12  Consults: none  Significant Diagnostic Studies: labs:   Treatments: IV hydration, analgesia: acetaminophen w/ codeine and surgery:   Discharge Exam: Blood pressure 123/82, pulse 89, temperature 98.2 F (36.8 C), temperature source Oral, resp. rate 15, SpO2 100.00%. GI: soft, non-tender; bowel sounds normal; no masses,  no organomegaly  Disposition: Home or Self Care   Current Discharge Medication List    CONTINUE these medications which have NOT CHANGED   Details  ciprofloxacin (CIPRO) 500 MG tablet Take 500 mg by mouth 2 (two) times daily.     olmesartan (BENICAR) 40 MG tablet Take 40 mg by mouth every morning.     omeprazole (PRILOSEC) 20 MG capsule Take 20 mg by mouth daily.     oxyCODONE-acetaminophen (PERCOCET) 5-325 MG per tablet Take 1 tablet by mouth every 6 (six) hours as needed. Pain          SignedJethro Bolus I 04/05/2011, 11:18 AM

## 2011-04-05 NOTE — Anesthesia Preprocedure Evaluation (Signed)
Anesthesia Evaluation  Patient identified by MRN, date of birth, ID band Patient awake    Reviewed: Allergy & Precautions, H&P , NPO status , Patient's Chart, lab work & pertinent test results, reviewed documented beta blocker date and time   Airway Mallampati: I TM Distance: >3 FB Neck ROM: Full    Dental   Pulmonary neg pulmonary ROS,  clear to auscultation        Cardiovascular hypertension, Pt. on medications Regular Normal Denies cardiac symptoms   Neuro/Psych Negative Neurological ROS  Negative Psych ROS   GI/Hepatic Neg liver ROS, GERD-  Medicated and Controlled,  Endo/Other  Negative Endocrine ROS  Renal/GU Ureteral stone  Genitourinary negative   Musculoskeletal negative musculoskeletal ROS (+)   Abdominal   Peds negative pediatric ROS (+)  Hematology negative hematology ROS (+)   Anesthesia Other Findings Lower retainer  Reproductive/Obstetrics negative OB ROS                           Anesthesia Physical Anesthesia Plan  ASA: II  Anesthesia Plan: General   Post-op Pain Management:    Induction: Intravenous  Airway Management Planned: LMA  Additional Equipment:   Intra-op Plan:   Post-operative Plan: Extubation in OR  Informed Consent: I have reviewed the patients History and Physical, chart, labs and discussed the procedure including the risks, benefits and alternatives for the proposed anesthesia with the patient or authorized representative who has indicated his/her understanding and acceptance.     Plan Discussed with: CRNA and Surgeon  Anesthesia Plan Comments:         Anesthesia Quick Evaluation

## 2011-04-05 NOTE — H&P (Signed)
Urology Admission H&P  Chief Complaint: L flank pain  History of Present Illness: 30 yo female post lithotripsy 02/18/11, with original 7mm  L proximal ureteral stone, now with chip os stone in her L distal ureter without progression, despite medical therapy. She is now for stone removal.  Past Medical History  Diagnosis Date  . Hypertension   . Blood dyscrasia     Leiden Factor V- diagnosed 2010   . GERD (gastroesophageal reflux disease)   . Chronic kidney disease     left ureteral stone    Past Surgical History  Procedure Date  . Appendectomy   . Dilation and curettage of uterus   . Other surgical history 2010     Essure sterilization procedure     Home Medications:  Prescriptions prior to admission  Medication Sig Dispense Refill  . ciprofloxacin (CIPRO) 500 MG tablet Take 500 mg by mouth 2 (two) times daily.       Marland Kitchen olmesartan (BENICAR) 40 MG tablet Take 40 mg by mouth every morning.       Marland Kitchen omeprazole (PRILOSEC) 20 MG capsule Take 20 mg by mouth daily.       Marland Kitchen oxyCODONE-acetaminophen (PERCOCET) 5-325 MG per tablet Take 1 tablet by mouth every 6 (six) hours as needed. Pain        Allergies:  Allergies  Allergen Reactions  . Penicillins Swelling and Rash    History reviewed. No pertinent family history. Social History:  reports that she has never smoked. She has never used smokeless tobacco. She reports that she does not drink alcohol or use illicit drugs.  ROS; l flank pain, suprapubic pain, dysuria2  Physical Exam:  Vital signs in last 24 hours: Temp:  [98.4 F (36.9 C)] 98.4 F (36.9 C) (11/23 0710) Pulse Rate:  [85] 85  (11/23 0710) Resp:  [18] 18  (11/23 0710) BP: (130)/(91) 130/91 mmHg (11/23 0710) SpO2:  [100 %] 100 % (11/23 0710) Physical Exam: Abd: soft, +bs. L flank pain to deep palpation.   Laboratory Data:  No results found for this or any previous visit (from the past 24 hour(s)). Recent Results (from the past 240 hour(s))  SURGICAL PCR SCREEN      Status: Normal   Collection Time   04/03/11 12:56 PM      Component Value Range Status Comment   MRSA, PCR NEGATIVE  NEGATIVE  Final    Staphylococcus aureus NEGATIVE  NEGATIVE  Final    Creatinine: 0.7  Impression/Assessment:  Retained stone fragment, L lower ureter.  Plan:  Removal of stone fragment.  [name] I 04/05/2011, 9:09 AM

## 2011-04-05 NOTE — Anesthesia Postprocedure Evaluation (Signed)
  Anesthesia Post-op Note  Patient: [name]  Procedure(s) Performed:  CYSTOSCOPY/RETROGRADE/URETEROSCOPY/STONE EXTRACTION WITH BASKET -  cysto, left retrograde, left ureteroscopy, left ureteral stone basketry, placement of double j stent left ureter  ; HOLMIUM LASER APPLICATION; CYSTOSCOPY WITH STENT REPLACEMENT  Patient Location: PACU  Anesthesia Type: General  Level of Consciousness: awake and oriented  Airway and Oxygen Therapy: Patient Spontanous Breathing  Post-op Pain: mild  Post-op Assessment: Post-op Vital signs reviewed, Patient's Cardiovascular Status Stable, Respiratory Function Stable and Patent Airway  Post-op Vital Signs: stable  Complications: No apparent anesthesia complications

## 2011-04-09 ENCOUNTER — Encounter (HOSPITAL_COMMUNITY): Payer: Self-pay | Admitting: Urology

## 2011-04-26 ENCOUNTER — Ambulatory Visit (INDEPENDENT_AMBULATORY_CARE_PROVIDER_SITE_OTHER): Payer: BC Managed Care – PPO

## 2011-04-26 DIAGNOSIS — H9120 Sudden idiopathic hearing loss, unspecified ear: Secondary | ICD-10-CM

## 2011-04-26 DIAGNOSIS — R05 Cough: Secondary | ICD-10-CM

## 2011-04-26 DIAGNOSIS — H6120 Impacted cerumen, unspecified ear: Secondary | ICD-10-CM

## 2011-04-26 DIAGNOSIS — H6980 Other specified disorders of Eustachian tube, unspecified ear: Secondary | ICD-10-CM

## 2011-04-26 DIAGNOSIS — H9209 Otalgia, unspecified ear: Secondary | ICD-10-CM

## 2011-04-26 DIAGNOSIS — R509 Fever, unspecified: Secondary | ICD-10-CM

## 2011-07-19 ENCOUNTER — Ambulatory Visit
Admission: RE | Admit: 2011-07-19 | Discharge: 2011-07-19 | Disposition: A | Discharge status: Home / Self Care | Payer: BC Managed Care – PPO | Source: Ambulatory Visit | Attending: Family Medicine | Admitting: Family Medicine

## 2011-07-19 ENCOUNTER — Ambulatory Visit (INDEPENDENT_AMBULATORY_CARE_PROVIDER_SITE_OTHER): Payer: BC Managed Care – PPO | Admitting: Family Medicine

## 2011-07-19 ENCOUNTER — Ambulatory Visit: Payer: BC Managed Care – PPO

## 2011-07-19 VITALS — BP 154/88 | HR 87 | Temp 97.9°F | Resp 16 | Ht 64.0 in | Wt 173.0 lb

## 2011-07-19 DIAGNOSIS — R110 Nausea: Secondary | ICD-10-CM

## 2011-07-19 DIAGNOSIS — I10 Essential (primary) hypertension: Secondary | ICD-10-CM

## 2011-07-19 DIAGNOSIS — R1011 Right upper quadrant pain: Secondary | ICD-10-CM

## 2011-07-19 DIAGNOSIS — K8020 Calculus of gallbladder without cholecystitis without obstruction: Secondary | ICD-10-CM

## 2011-07-19 LAB — POCT UA - MICROSCOPIC ONLY
Casts, Ur, LPF, POC: NEGATIVE
Crystals, Ur, HPF, POC: NEGATIVE
Mucus, UA: POSITIVE
Yeast, UA: NEGATIVE

## 2011-07-19 LAB — COMPREHENSIVE METABOLIC PANEL
ALT: 14 U/L (ref 0–35)
AST: 14 U/L (ref 0–37)
CO2: 25 mEq/L (ref 19–32)
Chloride: 106 mEq/L (ref 96–112)
Creat: 0.75 mg/dL (ref 0.50–1.10)
Sodium: 141 mEq/L (ref 135–145)
Total Bilirubin: 0.5 mg/dL (ref 0.3–1.2)
Total Protein: 7.3 g/dL (ref 6.0–8.3)

## 2011-07-19 LAB — COMPREHENSIVE METABOLIC PANEL WITH GFR
Albumin: 4.5 g/dL (ref 3.5–5.2)
Alkaline Phosphatase: 107 U/L (ref 39–117)
BUN: 12 mg/dL (ref 6–23)
Calcium: 8.8 mg/dL (ref 8.4–10.5)
Glucose, Bld: 75 mg/dL (ref 70–99)
Potassium: 3.8 meq/L (ref 3.5–5.3)

## 2011-07-19 LAB — POCT CBC
Granulocyte percent: 61.7 % (ref 37–80)
HCT, POC: 43.8 % (ref 37.7–47.9)
Hemoglobin: 14.4 g/dL (ref 12.2–16.2)
Lymph, poc: 2.7 (ref 0.6–3.4)
MCH, POC: 27.4 pg (ref 27–31.2)
MCHC: 32.9 g/dL (ref 31.8–35.4)
MCV: 83.5 fL (ref 80–97)
MID (cbc): 0.6 (ref 0–0.9)
MPV: 9.7 fL (ref 0–99.8)
POC Granulocyte: 5.3 (ref 2–6.9)
POC LYMPH PERCENT: 31.2 % (ref 10–50)
POC MID %: 7.1 %M (ref 0–12)
Platelet Count, POC: 330 10*3/uL (ref 142–424)
RBC: 5.25 M/uL (ref 4.04–5.48)
RDW, POC: 14.5 %
WBC: 8.6 10*3/uL (ref 4.6–10.2)

## 2011-07-19 LAB — POCT URINALYSIS DIPSTICK
Glucose, UA: NEGATIVE
Nitrite, UA: NEGATIVE
Protein, UA: 300
Spec Grav, UA: 1.025
Urobilinogen, UA: 1
pH, UA: 7

## 2011-07-19 MED ORDER — OLMESARTAN MEDOXOMIL 40 MG PO TABS: 40.0000 mg | ORAL_TABLET | ORAL | Status: DC

## 2011-07-19 MED ORDER — OXYCODONE-ACETAMINOPHEN 5-325 MG PO TABS: 1.0000 | ORAL_TABLET | Freq: Four times a day (QID) | ORAL | Status: DC | PRN

## 2011-07-19 MED ORDER — CIPROFLOXACIN HCL 250 MG PO TABS: 250.0000 mg | ORAL_TABLET | Freq: Two times a day (BID) | ORAL | Status: AC

## 2011-07-19 NOTE — Progress Notes (Signed)
[facility]:  Office Visit  Chief Complaint:  Chief Complaint  Patient presents with  . Abdominal Pain    RUQ - 1 week, nausea, pressure in rib cage. Patient was told she has gall stones 2 months ago    HPI: [name] is a 31 y.o. female who complains of  2 month h/o of intermittent RUQ pain , has worsened and is now more constant, rated 3-7/10 depending on time of day and if she ate. Worsens after fatty meals, tried avoiding meals with some relief. Cannot exercise. Has a h/o gallstones and renal stones. Started having worsening sxs after lithotrypsy in Nov 2012.  Past Medical History  Diagnosis Date  . Hypertension   . Blood dyscrasia     Leiden Factor V- diagnosed 2010   . GERD (gastroesophageal reflux disease)   . Chronic kidney disease     left ureteral stone   . Gallstones    Past Surgical History  Procedure Date  . Appendectomy   . Dilation and curettage of uterus   . Other surgical history 2010     Essure sterilization procedure   . Cystoscopy/retrograde/ureteroscopy/stone extraction with basket 04/05/2011    Procedure: CYSTOSCOPY/RETROGRADE/URETEROSCOPY/STONE EXTRACTION WITH BASKET;  Surgeon: Kathi Ludwig, MD;  Location: [facility];  Service: Urology;  Laterality: Left;   cysto, left retrograde, left ureteroscopy, left ureteral stone basketry, placement of double j stent left ureter    . Cystoscopy w/ ureteral stent placement 04/05/2011    Procedure: CYSTOSCOPY WITH STENT REPLACEMENT;  Surgeon: Kathi Ludwig, MD;  Location: [facility];  Service: Urology;  Laterality: Left;   History   Social History  . Marital Status: Married    Spouse Name: N/A    Number of Children: N/A  . Years of Education: N/A   Social History Main Topics  . Smoking status: Never Smoker   . Smokeless tobacco: Never Used  . Alcohol Use: No  . Drug Use: No  . Sexually Active:    Other Topics Concern  . None   Social History Narrative  . None   No  family history on file. Allergies  Allergen Reactions  . Penicillins Swelling and Rash   Prior to Admission medications   Medication Sig Start Date End Date Taking? Authorizing Provider  olmesartan (BENICAR) 40 MG tablet Take 40 mg by mouth every morning.    Yes Historical Provider, MD  omeprazole (PRILOSEC) 20 MG capsule Take 20 mg by mouth daily.    Yes Historical Provider, MD  oxyCODONE-acetaminophen (PERCOCET) 5-325 MG per tablet Take 1 tablet by mouth every 6 (six) hours as needed. Pain     Historical Provider, MD     ROS: The patient denies fevers, chills, night sweats, unintentional weight loss, chest pain, palpitations, wheezing, dyspnea on exertion,  dysuria, hematuria, melena, numbness, weakness, or tingling. +nausea, no vomiting, abdominal pain RUQ  All other systems have been reviewed and were otherwise negative with the exception of those mentioned in the HPI and as above.    PHYSICAL EXAM: Filed Vitals:   07/19/11 1108  BP: 154/88  Pulse: 87  Temp: 97.9 F (36.6 C)  Resp: 16   Filed Vitals:   07/19/11 1108  Height: 5\' 4"  (1.626 m)  Weight: 173 lb (78.472 kg)   Body mass index is 29.70 kg/(m^2).  General: Alert, no acute distress HEENT:  Normocephalic, atraumatic, oropharynx patent.  Cardiovascular:  Regular rate and rhythm, no rubs murmurs or gallops.  No  Carotid bruits, radial pulse intact. No pedal edema.  Respiratory: Clear to auscultation bilaterally.  No wheezes, rales, or rhonchi.  No cyanosis, no use of accessory musculature GI: No organomegaly, abdomen is soft,positive bowel sounds.  No masses.+ RUQ tenderness, slight guarding no rebound. No CVA tenderness Skin: No rashes. Neurologic: Facial musculature symmetric. Psychiatric: Patient is appropriate throughout our interaction. Lymphatic: No cervical lymphadenopathy Musculoskeletal: Gait intact.   LABS: Results for orders placed in visit on 07/19/11  POCT CBC      Component Value Range   WBC 8.6   4.6 - 10.2 (K/uL)   Lymph, poc 2.7  0.6 - 3.4    POC LYMPH PERCENT 31.2  10 - 50 (%L)   MID (cbc) 0.6  0 - 0.9    POC MID % 7.1  0 - 12 (%M)   POC Granulocyte 5.3  2 - 6.9    Granulocyte percent 61.7  37 - 80 (%G)   RBC 5.25  4.04 - 5.48 (M/uL)   Hemoglobin 14.4  12.2 - 16.2 (g/dL)   HCT, POC 16.1  09.6 - 47.9 (%)   MCV 83.5  80 - 97 (fL)   MCH, POC 27.4  27 - 31.2 (pg)   MCHC 32.9  31.8 - 35.4 (g/dL)   RDW, POC 04.5     Platelet Count, POC 330  142 - 424 (K/uL)   MPV 9.7  0 - 99.8 (fL)  POCT URINALYSIS DIPSTICK      Component Value Range   Color, UA RED     Clarity, UA TURBID     Glucose, UA NEG     Bilirubin, UA SMALL     Ketones, UA TRACE     Spec Grav, UA 1.025     Blood, UA LARGE     pH, UA 7.0     Protein, UA 300     Urobilinogen, UA 1.0     Nitrite, UA NEG     Leukocytes, UA large (3+)    POCT UA - MICROSCOPIC ONLY      Component Value Range   WBC, Ur, HPF, POC TNTC     RBC, urine, microscopic TNTC     Bacteria, U Microscopic 1+     Mucus, UA POS     Epithelial cells, urine per micros 10-12     Crystals, Ur, HPF, POC NEG     Casts, Ur, LPF, POC NEG     Yeast, UA NEG       EKG/XRAY:   Primary read interpreted by Dr. Conley Rolls at [facility]. Acute abdomen-no acute changes, no dilation, free air, ileus, obstruction, stones noted.    ASSESSMENT/PLAN: Encounter Diagnoses  Name Primary?  . Abdominal pain, acute, right upper quadrant Yes  . Nausea   . HTN (hypertension)     1. Will get [place] for ? Gallbladder stones, once review refer to [facility] 2. UTI-questionable since too many epithelial cells. Will empirically treat with Cipro 250 mg BID x 3 days. Get urine cx 3. Nausea-patient still has phenergen from lithotripsy 4. Rx Reorder percocet.  5. HTN-Reorder Benicar, labs pending: CMP    Tanya Staubs PHUONG, DO 07/19/2011 12:42 PM

## 2011-07-21 LAB — URINE CULTURE
Colony Count: NO GROWTH
Organism ID, Bacteria: NO GROWTH

## 2011-07-25 ENCOUNTER — Telehealth: Payer: Self-pay | Admitting: Family Medicine

## 2011-07-25 NOTE — Telephone Encounter (Signed)
LM  Regarding referral to [facility] for symptomatic gallstones, also told her results of labwork and also [place] report.

## 2011-08-20 ENCOUNTER — Ambulatory Visit (INDEPENDENT_AMBULATORY_CARE_PROVIDER_SITE_OTHER): Payer: BC Managed Care – PPO | Admitting: General Surgery

## 2011-08-21 ENCOUNTER — Ambulatory Visit (INDEPENDENT_AMBULATORY_CARE_PROVIDER_SITE_OTHER): Payer: BC Managed Care – PPO | Admitting: Physician Assistant

## 2011-08-21 VITALS — BP 110/71 | HR 97 | Temp 99.3°F | Resp 18 | Ht 64.0 in | Wt 170.0 lb

## 2011-08-21 DIAGNOSIS — J301 Allergic rhinitis due to pollen: Secondary | ICD-10-CM

## 2011-08-21 DIAGNOSIS — J020 Streptococcal pharyngitis: Secondary | ICD-10-CM

## 2011-08-21 DIAGNOSIS — J029 Acute pharyngitis, unspecified: Secondary | ICD-10-CM

## 2011-08-21 MED ORDER — DIPHENHYD-HYDROCORT-NYSTATIN MT SUSP: 5.0000 mL | Freq: Four times a day (QID) | OROMUCOSAL | Status: DC | PRN

## 2011-08-21 MED ORDER — FLUTICASONE PROPIONATE 50 MCG/ACT NA SUSP: 2.0000 | Freq: Every day | NASAL | Status: DC

## 2011-08-21 MED ORDER — AZITHROMYCIN 250 MG PO TABS
ORAL_TABLET | ORAL | Status: AC
Start: 2011-08-21 — End: 2011-08-26

## 2011-08-21 NOTE — Progress Notes (Signed)
Patient ID: JERELINE TICER MRN: [id number], DOB: 06/21/1980, 31 y.o. Date of Encounter: 08/21/2011, 11:13 AM  Primary Physician: Olene Floss, PA  Chief Complaint:  Chief Complaint  Patient presents with  . Sore Throat  . Allergic Rhinitis     HPI: 31 y.o. year old female presents with a 2 day history of sore throat. Subjective fever and chills. No cough, congestion, rhinorrhea, sinus pressure, otalgia, or headache. Normal hearing. No GI complaints. Able to swallow saliva, but hurts to do so. Decreased appetite secondary to sore throat.  She also mentions worsening allergic rhinitis. Increasing amounts of sneezing, itchy eyes, and rhinorrhea. Has been taking Allegra with minimal results.    Past Medical History  Diagnosis Date  . Hypertension   . Blood dyscrasia     Leiden Factor V- diagnosed 2010   . GERD (gastroesophageal reflux disease)   . Chronic kidney disease     left ureteral stone   . Gallstones      Home Meds: Prior to Admission medications   Medication Sig Start Date End Date Taking? Authorizing Provider  olmesartan (BENICAR) 40 MG tablet Take 1 tablet (40 mg total) by mouth every morning. 07/19/11  Yes Thao P Le, DO  omeprazole (PRILOSEC) 20 MG capsule Take 20 mg by mouth daily.    Yes Historical Provider, MD  azithromycin (ZITHROMAX Z-PAK) 250 MG tablet 2 tabs po first day, then 1 tab po next 4 days 08/21/11 08/26/11  Sondra Barges, PA-C  Diphenhyd-Hydrocort-Nystatin SUSP Use as directed 5 mLs in the mouth or throat 4 (four) times daily as needed. 08/21/11   Zaiah Credeur M Aniesa Boback, PA-C  fluticasone (FLONASE) 50 MCG/ACT nasal spray Place 2 sprays into the nose daily. 08/21/11 08/20/12  Katalina Magri M Kischa Altice, PA-C  oxyCODONE-acetaminophen (PERCOCET) 5-325 MG per tablet Take 1 tablet by mouth every 6 (six) hours as needed. Pain 07/19/11   Thao P Le, DO    Allergies:  Allergies  Allergen Reactions  . Penicillins Swelling and Rash    History   Social History  . Marital Status:  Married    Spouse Name: N/A    Number of Children: N/A  . Years of Education: N/A   Occupational History  . Not on file.   Social History Main Topics  . Smoking status: Never Smoker   . Smokeless tobacco: Never Used  . Alcohol Use: No  . Drug Use: No  . Sexually Active:    Other Topics Concern  . Not on file   Social History Narrative  . No narrative on file     Review of Systems: Constitutional: negative for night sweats or weight changes HEENT: see above Cardiovascular: negative for chest pain or palpitations Respiratory: negative for hemoptysis, wheezing, or shortness of breath Abdominal: negative for abdominal pain, nausea, vomiting or diarrhea Dermatological: negative for rash Neurologic: negative for headache   Physical Exam: Blood pressure 110/71, pulse 97, temperature 99.3 F (37.4 C), temperature source Oral, resp. rate 18, height 5\' 4"  (1.626 m), weight 170 lb (77.111 kg), last menstrual period 08/12/2011., Body mass index is 29.18 kg/(m^2). General: Well developed, well nourished, in no acute distress. Head: Normocephalic, atraumatic, eyes without discharge, sclera non-icteric, nares are patent. Bilateral auditory canals clear, TM's are without perforation, pearly grey with reflective cone of light bilaterally. No sinus TTP. Oral cavity moist, dentition normal. Posterior pharynx with post nasal drip and mild erythema. No peritonsillar abscess or tonsillar exudate. Neck: Supple. No thyromegaly. Full ROM. No lymphadenopathy.  Lungs: Clear bilaterally to auscultation without wheezes, rales, or rhonchi. Breathing is unlabored. Heart: RRR with S1 S2. No murmurs, rubs, or gallops appreciated. Msk:  Strength and tone normal for age. Extremities: No clubbing or cyanosis. No edema. Neuro: Alert and oriented X 3. Moves all extremities spontaneously. CNII-XII grossly in tact. Psych:  Responds to questions appropriately with a normal affect.   Labs: Results for orders  placed in visit on 08/21/11  POCT RAPID STREP A (OFFICE)      Component Value Range   Rapid Strep A Screen Positive (*) Negative      ASSESSMENT AND PLAN:  31 y.o. year old female with strep pharyngitis and allergic rhinitis 1. Strep pharyngitis -Azithromycin 250 MG #6 2 po first day then 1 po next 4 days no RF -Tylenol/Motrin prn -Rest/fluids -New tooth brush -RTC precautions -RTC 3 days if no improvement  2. Allergic rhinitis -Flonase 2 sprays each nare daily #1 RF 6 -Continue Allegra   Signed, Eula Listen, PA-C 08/21/2011 11:13 AM

## 2011-08-26 ENCOUNTER — Telehealth: Payer: Self-pay

## 2011-08-26 NOTE — Telephone Encounter (Signed)
PT STATES SHE STILL HAVE STREP AND IT'S HARD TO SWALLOW. WOULD LIKE TO HAVE SOMETHING ELSE CALLED IN PLEASE CALL [phone]    [facility] IN [place]

## 2011-08-27 NOTE — Telephone Encounter (Signed)
It is possible to have an azithromycin resistant strep. Please ask if patient has tolerated any medications like Keflex or Omnicef. They are cousins to penicillins.  [name]

## 2011-08-28 MED ORDER — OLOPATADINE HCL 0.2 % OP SOLN: 1.0000 [drp] | Freq: Every day | OPHTHALMIC | Status: DC

## 2011-08-28 MED ORDER — CEFDINIR 300 MG PO CAPS: 300.0000 mg | ORAL_CAPSULE | Freq: Two times a day (BID) | ORAL | Status: AC

## 2011-08-28 NOTE — Telephone Encounter (Signed)
Advised pt that we called in two rx's for her

## 2011-08-28 NOTE — Telephone Encounter (Signed)
Has had omnicef before and she has watery and draining eyes

## 2011-08-28 NOTE — Telephone Encounter (Signed)
LMOM to call back

## 2011-08-28 NOTE — Telephone Encounter (Signed)
Patient states she has taken [facility] previously without issue. Will also call in some [place] to help with her allergic conjunctivitis.  [name]

## 2011-09-16 ENCOUNTER — Ambulatory Visit (INDEPENDENT_AMBULATORY_CARE_PROVIDER_SITE_OTHER): Payer: BC Managed Care – PPO | Admitting: General Surgery

## 2012-07-11 ENCOUNTER — Ambulatory Visit (INDEPENDENT_AMBULATORY_CARE_PROVIDER_SITE_OTHER): Payer: BC Managed Care – PPO | Admitting: Family Medicine

## 2012-07-11 VITALS — BP 131/92 | HR 116 | Temp 99.5°F | Resp 17 | Ht 63.5 in | Wt 177.0 lb

## 2012-07-11 DIAGNOSIS — J029 Acute pharyngitis, unspecified: Secondary | ICD-10-CM

## 2012-07-11 DIAGNOSIS — J020 Streptococcal pharyngitis: Secondary | ICD-10-CM

## 2012-07-11 LAB — POCT RAPID STREP A (OFFICE): Rapid Strep A Screen: POSITIVE — AB

## 2012-07-11 MED ORDER — AZITHROMYCIN 250 MG PO TABS: ORAL_TABLET | ORAL | Status: DC

## 2012-07-11 MED ORDER — DIPHENHYD-HYDROCORT-NYSTATIN MT SUSP: 5.0000 mL | Freq: Four times a day (QID) | OROMUCOSAL | Status: DC | PRN

## 2012-07-11 NOTE — Patient Instructions (Signed)

## 2012-07-11 NOTE — Progress Notes (Signed)
[facility]:  Office Visit  Chief Complaint:  Chief Complaint  Patient presents with  . Sinusitis  . Nasal Congestion    HPI: [name] is a 32 y.o. female who complains of  1 week h/o sore throat, enlarged tonsils, and fevers, with sinus congestion, No other meds. + sinus HA, sinus pain and also dry cough, minimal ear pain. Fever Tm 100.3 last night.   Past Medical History  Diagnosis Date  . Hypertension   . Blood dyscrasia     Leiden Factor V- diagnosed 2010   . GERD (gastroesophageal reflux disease)   . Chronic kidney disease     left ureteral stone   . Gallstones    Past Surgical History  Procedure Laterality Date  . Appendectomy    . Dilation and curettage of uterus    . Other surgical history  2010     Essure sterilization procedure   . Cystoscopy/retrograde/ureteroscopy/stone extraction with basket  04/05/2011    Procedure: CYSTOSCOPY/RETROGRADE/URETEROSCOPY/STONE EXTRACTION WITH BASKET;  Surgeon: Kathi Ludwig, MD;  Location: [facility];  Service: Urology;  Laterality: Left;   cysto, left retrograde, left ureteroscopy, left ureteral stone basketry, placement of double j stent left ureter    . Cystoscopy w/ ureteral stent placement  04/05/2011    Procedure: CYSTOSCOPY WITH STENT REPLACEMENT;  Surgeon: Kathi Ludwig, MD;  Location: [facility];  Service: Urology;  Laterality: Left;   History   Social History  . Marital Status: Married    Spouse Name: N/A    Number of Children: N/A  . Years of Education: N/A   Social History Main Topics  . Smoking status: Never Smoker   . Smokeless tobacco: Never Used  . Alcohol Use: No  . Drug Use: No  . Sexually Active: Yes    Birth Control/ Protection: Abstinence, None   Other Topics Concern  . None   Social History Narrative  . None   Family History  Problem Relation Age of Onset  . Diabetes Father   . Hypertension Father   . Hypertension Mother   . Hypertension Maternal  Grandmother   . Hypertension Maternal Grandfather   . Hypertension Paternal Grandmother   . Hypertension Paternal Grandfather    Allergies  Allergen Reactions  . Penicillins Swelling and Rash   Prior to Admission medications   Medication Sig Start Date End Date Taking? Authorizing Provider  olmesartan (BENICAR) 40 MG tablet Take 1 tablet (40 mg total) by mouth every morning. 07/19/11  Yes Gennie Dib P Chamille Werntz, DO  omeprazole (PRILOSEC) 20 MG capsule Take 20 mg by mouth daily.    Yes Historical Provider, MD     ROS: The patient deniesnight sweats, unintentional weight loss, chest pain, palpitations, wheezing, dyspnea on exertion, nausea, vomiting, abdominal pain, dysuria, hematuria, melena, numbness, weakness, or tingling.   All other systems have been reviewed and were otherwise negative with the exception of those mentioned in the HPI and as above.    PHYSICAL EXAM: Filed Vitals:   07/11/12 1530  BP: 131/92  Pulse: 116  Temp: 99.5 F (37.5 C)  Resp: 17   Filed Vitals:   07/11/12 1530  Height: 5' 3.5" (1.613 m)  Weight: 177 lb (80.287 kg)   Body mass index is 30.86 kg/(m^2).  General: Alert, no acute distress HEENT:  Normocephalic, atraumatic, oropharynx patent. TM nl, + erythematous tonsils, left larger than right +2, no exudates, minimal sinus tenderness Cardiovascular:  Regular rate and rhythm, no rubs murmurs  or gallops.  No Carotid bruits, radial pulse intact. No pedal edema.  Respiratory: Clear to auscultation bilaterally.  No wheezes, rales, or rhonchi.  No cyanosis, no use of accessory musculature GI: No organomegaly, abdomen is soft and non-tender, positive bowel sounds.  No masses. Skin: No rashes. Neurologic: Facial musculature symmetric. Psychiatric: Patient is appropriate throughout our interaction. Lymphatic: No cervical lymphadenopathy Musculoskeletal: Gait intact.   LABS: Results for orders placed in visit on 07/11/12  POCT RAPID STREP A (OFFICE)      Result  Value Range   Rapid Strep A Screen Positive (*) Negative     EKG/XRAY:   Primary read interpreted by Dr. Conley Rolls at [facility].   ASSESSMENT/PLAN: Encounter Diagnoses  Name Primary?  . Streptococcal sore throat   . Acute pharyngitis Yes   PCN allergy Rx Azithromycin Rx Benadryl, Nystatin, Hydro syrup F/u prn    Malaiah Viramontes PHUONG, DO 07/11/2012 4:11 PM

## 2013-05-14 ENCOUNTER — Ambulatory Visit (INDEPENDENT_AMBULATORY_CARE_PROVIDER_SITE_OTHER): Payer: BC Managed Care – PPO | Admitting: Family Medicine

## 2013-05-14 ENCOUNTER — Encounter: Payer: Self-pay | Admitting: Family Medicine

## 2013-05-14 VITALS — BP 130/80 | HR 76 | Temp 99.3°F | Resp 16 | Ht 64.0 in | Wt 179.0 lb

## 2013-05-14 DIAGNOSIS — J029 Acute pharyngitis, unspecified: Secondary | ICD-10-CM

## 2013-05-14 DIAGNOSIS — J020 Streptococcal pharyngitis: Secondary | ICD-10-CM

## 2013-05-14 DIAGNOSIS — R05 Cough: Secondary | ICD-10-CM

## 2013-05-14 DIAGNOSIS — D6851 Activated protein C resistance: Secondary | ICD-10-CM | POA: Insufficient documentation

## 2013-05-14 DIAGNOSIS — R059 Cough, unspecified: Secondary | ICD-10-CM

## 2013-05-14 LAB — POCT INFLUENZA A/B
INFLUENZA A, POC: NEGATIVE
Influenza B, POC: NEGATIVE

## 2013-05-14 MED ORDER — AZITHROMYCIN 250 MG PO TABS: ORAL_TABLET | ORAL | Status: DC

## 2013-05-14 NOTE — Progress Notes (Signed)
[facility] [address], [place] [address] [phone]  Date:  05/14/2013   Name:  [name]   DOB:  1980-06-11   MRN:  [id number]  PCP:  JEFFERY,CHELLE, PA-C    Chief Complaint: Cough   History of Present Illness:  [name] is a 33 y.o. very pleasant female patient who presents with the following: Pt is complaining of a constant productive cough with yellow sputum that started 2 days ago. Pt states that she is unable to take deep breaths. Pt reports taking mucinex to relieve her cough symptoms, with mild relief. She states that her daughter has been sick with cold like symptoms. She denies sore throat, rhinorrhea, emesis, nausea, and abdominal pain.  Her LNMP 04/29/13. Otherwise pt is generally a healthy person. Allergic to pcn but no other allergies LMP 04/29/13  History of BTL   There are no active problems to display for this patient.   Past Medical History  Diagnosis Date   Hypertension    Blood dyscrasia     Leiden Factor V- diagnosed 2010    GERD (gastroesophageal reflux disease)    Chronic kidney disease     left ureteral stone    Gallstones     Past Surgical History  Procedure Laterality Date   Appendectomy     Dilation and curettage of uterus     Other surgical history  2010     Essure sterilization procedure    Cystoscopy/retrograde/ureteroscopy/stone extraction with basket  04/05/2011    Procedure: CYSTOSCOPY/RETROGRADE/URETEROSCOPY/STONE EXTRACTION WITH BASKET;  Surgeon: Kathi Ludwig, MD;  Location: [facility];  Service: Urology;  Laterality: Left;   cysto, left retrograde, left ureteroscopy, left ureteral stone basketry, placement of double j stent left ureter     Cystoscopy w/ ureteral stent placement  04/05/2011    Procedure: CYSTOSCOPY WITH STENT REPLACEMENT;  Surgeon: Kathi Ludwig, MD;  Location: [facility];  Service: Urology;  Laterality: Left;    History  Substance Use Topics   Smoking  status: Never Smoker    Smokeless tobacco: Never Used   Alcohol Use: No    Family History  Problem Relation Age of Onset   Diabetes Father    Hypertension Father    Hypertension Mother    Hypertension Maternal Grandmother    Hypertension Maternal Grandfather    Hypertension Paternal Grandmother    Hypertension Paternal Grandfather     Allergies  Allergen Reactions   Penicillins Swelling and Rash    Medication list has been reviewed and updated.  Current Outpatient Prescriptions on File Prior to Visit  Medication Sig Dispense Refill   olmesartan (BENICAR) 40 MG tablet Take 1 tablet (40 mg total) by mouth every morning.  30 tablet  5   omeprazole (PRILOSEC) 20 MG capsule Take 20 mg by mouth daily.        azithromycin (ZITHROMAX) 250 MG tablet Take 2 tabs PO now then 1 tab PO daily for the next 4 days  6 tablet  0   Diphenhyd-Hydrocort-Nystatin SUSP Use as directed 5 mLs in the mouth or throat 4 (four) times daily as needed.  180 mL  0   No current facility-administered medications on file prior to visit.    Review of Systems:  As per HPI, otherwise negative  Physical Examination: Filed Vitals:   05/14/13 0833  BP: 130/80  Pulse: 100  Temp: 99.3 F (37.4 C)  Resp: 16    Body mass index is 30.71  kg/(m^2).   GEN: WDWN, NAD, Non-toxic, A & O x 3. Overweight but looks well HEENT: Atraumatic, Normocephalic. Neck supple. No masses, No LAD.  Bilateral TM wnl, oropharynx normal.  PEERL,EOMI.   Ears and Nose: No external deformity. CV: RRR, No M/G/R. No JVD. No thrill. No extra heart sounds. PULM: CTA B, no wheezes, crackles, rhonchi. No retractions. No resp. distress. No accessory muscle use. ABD: S, NT, ND, +BS. No rebound. No HSM. EXTR: No c/c/e NEURO Normal gait.  PSYCH: Normally interactive. Conversant. Not depressed or anxious appearing.  Calm demeanor.   Results for orders placed in visit on 05/14/13  POCT INFLUENZA A/B      Result Value Range    Influenza A, POC Negative     Influenza B, POC Negative      Assessment and Plan: Cough - Plan: POCT Influenza A/B  Streptococcal sore throat - Plan: azithromycin (ZITHROMAX) 250 MG tablet  Acute pharyngitis - Plan: azithromycin (ZITHROMAX) 250 MG tablet  See patient instructions for more details.     9:40 AM- Advised pt to take Delsym to relieve her cough symptoms. However, if she feels like her symptoms are worsening in the next couple of days she should come in for a recheck. Pt advised of plan for treatment and pt agrees.   Signed Abbe AmsterdamJessica Copland, MD

## 2013-05-14 NOTE — Patient Instructions (Signed)
Use the azithromycin antibiotic as directed.  You can continue to use OTC medications as needed as well.  Let me know if you are not feeling better in the next few days- Sooner if worse.

## 2013-05-15 ENCOUNTER — Ambulatory Visit (INDEPENDENT_AMBULATORY_CARE_PROVIDER_SITE_OTHER): Payer: BC Managed Care – PPO | Admitting: Family Medicine

## 2013-05-15 ENCOUNTER — Ambulatory Visit: Payer: BC Managed Care – PPO

## 2013-05-15 ENCOUNTER — Telehealth: Payer: Self-pay

## 2013-05-15 VITALS — BP 120/88 | HR 85 | Temp 98.0°F | Resp 17 | Ht 65.0 in | Wt 180.0 lb

## 2013-05-15 DIAGNOSIS — R05 Cough: Secondary | ICD-10-CM

## 2013-05-15 DIAGNOSIS — R059 Cough, unspecified: Secondary | ICD-10-CM

## 2013-05-15 DIAGNOSIS — T7840XA Allergy, unspecified, initial encounter: Secondary | ICD-10-CM

## 2013-05-15 MED ORDER — DIPHENHYDRAMINE HCL 50 MG/ML IJ SOLN
25.0000 mg | Freq: Once | INTRAMUSCULAR | Status: AC
  Administered 2013-05-15: 25 mg via INTRAMUSCULAR

## 2013-05-15 MED ORDER — EPINEPHRINE 0.3 MG/0.3ML IJ SOAJ: 0.3000 mg | Freq: Once | INTRAMUSCULAR | Status: AC

## 2013-05-15 MED ORDER — PREDNISONE 20 MG PO TABS: ORAL_TABLET | ORAL | Status: DC

## 2013-05-15 MED ORDER — METHYLPREDNISOLONE SODIUM SUCC 125 MG IJ SOLR
125.0000 mg | Freq: Once | INTRAMUSCULAR | Status: AC
  Administered 2013-05-15: 125 mg via INTRAMUSCULAR

## 2013-05-15 MED ORDER — DOXYCYCLINE HYCLATE 100 MG PO TABS: 100.0000 mg | ORAL_TABLET | Freq: Two times a day (BID) | ORAL | Status: DC

## 2013-05-15 NOTE — Telephone Encounter (Signed)
Called her back.  She is having a rash and swelling of his eyes.  Admits that she has slight swelling of her lips and tongue.   Advised her to take benadryl 50mg  now and have her husband take her to the ER.  She declines to go to the ER, does not feel that she needs to do this but will come and see us now.  She has taken benadryl already.  Advised her that we will watch out for her, but that if her swelling gets worse she needs to go to the ER instead.  She agreed and is on her way  Advised to stop taking azithromycin right away as she is likely allergic

## 2013-05-15 NOTE — Patient Instructions (Addendum)
You appear to have had an allergic reaction to azithromycin.  Do not take this medication or any other macrolide such as erythromycin again!    Keep your epipen handy especially for the next few days.  Look at the pen carefully and watch online videos to be sure you know how to use it.   Continue to take benadryl 25- 50 mg revery 4- 6 hours as needed for itching and hives For the next 10- 14 days also take a daily zyrtec or claritin, as well as ranitidine (zantac) OTC.    Use the prednisone (steroid pill) as directed starting tomorrow.  However if your sx seem to be resolved by tomorrow you can skip this medication if you like.  You received a steroid shot today.   We are going to use doxycycline for your bronchitis.  However you might certainly delay starting this medication for a couple of days to ensure your current allergic reaction dies down   Let me know if you have any other concerns or problems.  Please seek help immediatly if you have any shortness or breath or swelling that impedes your breathing in any way.  In this situation use your epi- pen and call 911

## 2013-05-15 NOTE — Telephone Encounter (Signed)
PATIENT SAW DR. Patsy LagerOPLAND Friday FOR A COUGH. SHE WAS GIVEN A Z-PACK. THIS MORNING SHE HAS NOTICED HIVES AND SWELLING ALL OVER HER BODY. WHAT SHOULD SHE DO NEXT? BEST PHONE [phone] (HOME)  PHARMACY CHOICE IS [facility] IN [place], [place]    MBC

## 2013-05-15 NOTE — Telephone Encounter (Signed)
Can we change to something else? 

## 2013-05-15 NOTE — Progress Notes (Signed)
[facility] [address], [place] [address] [phone]  Date:  05/15/2013   Name:  [name]   DOB:  01-16-1981   MRN:  [id number]  PCP:  JEFFERY,CHELLE, PA-C    Chief Complaint: Rash and tongue swelling   History of Present Illness:  [name] is a 33 y.o. very pleasant female patient who presents with the following:  She was seen here yesterday with cough and give an rx for azithromycin which she has taken in the past without a problem- she took her first dose last night.  She was ok when she went to bed. She work up this am around 0930 and she had swelling on her face, eyes, tongue and lips and itchy hives on her body. She did not have any SOB but her eye swelling was so bad that she could barely see.  She took Childrens benadryl liquid (estimate 25 mg) around 0930 today and called clinic.  I called her back and recommended that she go to the nearest ER- however by that time her sx were abating so she elected to come into urgent instead.  Her mother drove her here today  She appeared much better on presentation to clinic.  She did show a photo from her phone from earlier today when her face was indeed quite swollen, especially her eyes.    Patient Active Problem List   Diagnosis Date Noted  . Factor 5 Leiden mutation, heterozygous 05/14/2013    Past Medical History  Diagnosis Date  . Hypertension   . Blood dyscrasia     Leiden Factor V- diagnosed 2010   . GERD (gastroesophageal reflux disease)   . Chronic kidney disease     left ureteral stone   . Gallstones     Past Surgical History  Procedure Laterality Date  . Appendectomy    . Dilation and curettage of uterus    . Other surgical history  2010     Essure sterilization procedure   . Cystoscopy/retrograde/ureteroscopy/stone extraction with basket  04/05/2011    Procedure: CYSTOSCOPY/RETROGRADE/URETEROSCOPY/STONE EXTRACTION WITH BASKET;  Surgeon: Kathi Ludwig, MD;  Location:  [facility];  Service: Urology;  Laterality: Left;   cysto, left retrograde, left ureteroscopy, left ureteral stone basketry, placement of double j stent left ureter    . Cystoscopy w/ ureteral stent placement  04/05/2011    Procedure: CYSTOSCOPY WITH STENT REPLACEMENT;  Surgeon: Kathi Ludwig, MD;  Location: [facility];  Service: Urology;  Laterality: Left;  . Tubal ligation      History  Substance Use Topics  . Smoking status: Never Smoker   . Smokeless tobacco: Never Used  . Alcohol Use: No    Family History  Problem Relation Age of Onset  . Diabetes Father   . Hypertension Father   . Hypertension Mother   . Hypertension Maternal Grandmother   . Hypertension Maternal Grandfather   . Hypertension Paternal Grandmother   . Hypertension Paternal Grandfather     Allergies  Allergen Reactions  . Azithromycin     Swelling and hives  . Penicillins Swelling and Rash    Medication list has been reviewed and updated.  Current Outpatient Prescriptions on File Prior to Visit  Medication Sig Dispense Refill  . Diphenhyd-Hydrocort-Nystatin SUSP Use as directed 5 mLs in the mouth or throat 4 (four) times daily as needed.  180 mL  0  . olmesartan (BENICAR) 40 MG tablet Take 1 tablet (40 mg total)  by mouth every morning.  30 tablet  5  . omeprazole (PRILOSEC) 20 MG capsule Take 20 mg by mouth daily.        No current facility-administered medications on file prior to visit.    Review of Systems:  As per HPI- otherwise negative.   Physical Examination: Filed Vitals:   05/15/13 1109  BP: 126/78  Pulse: 98  Temp: 98 F (36.7 C)  Resp: 17   Filed Vitals:   05/15/13 1109  Height: 5\' 5"  (1.651 m)  Weight: 180 lb (81.647 kg)   Body mass index is 29.95 kg/(m^2). Ideal Body Weight: Weight in (lb) to have BMI = 25: 149.9  GEN: WDWN, NAD, Non-toxic, A & O x 3, overweight, no distress.  At this time her allergic reaction physical findings are much reduced compared to her photo  from earlier today.   HEENT: Atraumatic, Normocephalic. Neck supple. No masses, No LAD.  Bilateral TM wnl, oropharynx normal.  PEERL,EOMI.   No angioedema evident of her lips or soft palate.  She does feel that the left side of her tongue is slightly swollen but this is not visibly evident  Ears and Nose: No external deformity. CV: RRR, No M/G/R. No JVD. No thrill. No extra heart sounds. PULM: CTA B, no wheezes, crackles, rhonchi. No retractions. No resp. distress. No accessory muscle use. ABD: S, NT, ND, +BS. No rebound. No HSM. EXTR: No c/c/e NEURO Normal gait.  PSYCH: Normally interactive. Conversant. Not depressed or anxious appearing.  Calm demeanor.  She has small urticaria and pruritis on her abdomen and on her hips.    Pt given zantac 150 and zyrtec 10 mg once while in clinic.  I cannot document this on her med list in Epic as it is not hospital formulary   [facility] reading (PRIMARY) by  Dr. Patsy Lageropland. CXR: negative  CHEST 2 VIEW  COMPARISON: 04/03/2011  FINDINGS: The heart size and mediastinal contours are within normal limits. Both lungs are clear. The visualized skeletal structures are unremarkable.  IMPRESSION: No active cardiopulmonary disease.  Given IM solu- medrol and benadryl 25 IM as well prior to discharge.    Assessment and Plan: Allergic reaction, initial encounter - Plan: predniSONE (DELTASONE) 20 MG tablet, EPINEPHrine (EPIPEN) 0.3 mg/0.3 mL SOAJ injection, methylPREDNISolone sodium succinate (SOLU-MEDROL) 125 mg/2 mL injection 125 mg, diphenhydrAMINE (BENADRYL) injection 25 mg  Cough - Plan: DG Chest 2 View, doxycycline (VIBRA-TABS) 100 MG tablet  [name] is here today with an allergic reaction, likely to the azithromycin that she took yesterday.  At this time her sx have abated and she does not seem to be in any immediate danger of respiratory compromise.  However discussed with her in detail what she should do if she has significant tongue or mouth swelling;  use epi- pen and call 911.  Given rx for epi- pen and showed how to use pen.    See patient instructions for more details.     Signed Abbe AmsterdamJessica Elliannah Wayment, MD

## 2013-05-17 ENCOUNTER — Telehealth: Payer: Self-pay

## 2013-05-17 NOTE — Telephone Encounter (Signed)
Called her, this may take some time to clear. She is using Zyrtec and Zantac. She indicates it went away then has come back, advised her to return to clinic if she has any facial symptoms, currently her legs/ trunk only. Advised her to continue the medication and give a little more time. She states concerned because it went away then came back. Advised her sometimes this does happen if she gets hot or takes a shower.

## 2013-05-17 NOTE — Telephone Encounter (Signed)
Patient was seen Saturday. Sees Dr Patsy Lageropland. Says she was seen for allergic reaction. Woke up and the rash has come back. Its only from her stomach down to her legs. She has taken 2 Benadryl and some prednisone but its not working. Advised patient she may need to come back in to be reevaluated for another possible course of treatment. Cb# [phone].

## 2013-07-14 ENCOUNTER — Ambulatory Visit (INDEPENDENT_AMBULATORY_CARE_PROVIDER_SITE_OTHER): Payer: BC Managed Care – PPO | Admitting: Adult Health

## 2013-07-14 ENCOUNTER — Encounter (INDEPENDENT_AMBULATORY_CARE_PROVIDER_SITE_OTHER): Payer: Self-pay

## 2013-07-14 ENCOUNTER — Encounter: Payer: Self-pay | Admitting: Adult Health

## 2013-07-14 VITALS — BP 120/84 | Temp 98.4°F | Ht 64.0 in | Wt 177.0 lb

## 2013-07-14 DIAGNOSIS — N949 Unspecified condition associated with female genital organs and menstrual cycle: Secondary | ICD-10-CM

## 2013-07-14 DIAGNOSIS — R319 Hematuria, unspecified: Secondary | ICD-10-CM

## 2013-07-14 DIAGNOSIS — Z3202 Encounter for pregnancy test, result negative: Secondary | ICD-10-CM

## 2013-07-14 LAB — POCT URINALYSIS DIPSTICK
Glucose, UA: NEGATIVE
KETONES UA: NEGATIVE
Leukocytes, UA: NEGATIVE
Nitrite, UA: NEGATIVE

## 2013-07-14 LAB — COMPREHENSIVE METABOLIC PANEL
ALK PHOS: 78 U/L (ref 39–117)
ALT: 15 U/L (ref 0–35)
AST: 13 U/L (ref 0–37)
Albumin: 4.2 g/dL (ref 3.5–5.2)
BILIRUBIN TOTAL: 1.1 mg/dL (ref 0.2–1.2)
BUN: 10 mg/dL (ref 6–23)
CO2: 25 mEq/L (ref 19–32)
CREATININE: 0.69 mg/dL (ref 0.50–1.10)
Calcium: 9 mg/dL (ref 8.4–10.5)
Chloride: 107 mEq/L (ref 96–112)
GLUCOSE: 79 mg/dL (ref 70–99)
POTASSIUM: 3.5 meq/L (ref 3.5–5.3)
Sodium: 141 mEq/L (ref 135–145)
Total Protein: 6.5 g/dL (ref 6.0–8.3)

## 2013-07-14 LAB — CBC
HEMATOCRIT: 39.1 % (ref 36.0–46.0)
Hemoglobin: 13.5 g/dL (ref 12.0–15.0)
MCH: 28.8 pg (ref 26.0–34.0)
MCHC: 34.5 g/dL (ref 30.0–36.0)
MCV: 83.5 fL (ref 78.0–100.0)
PLATELETS: 270 10*3/uL (ref 150–400)
RBC: 4.68 MIL/uL (ref 3.87–5.11)
RDW: 14.2 % (ref 11.5–15.5)
WBC: 9.3 10*3/uL (ref 4.0–10.5)

## 2013-07-14 LAB — SEDIMENTATION RATE: Sed Rate: 1 mm/hr (ref 0–22)

## 2013-07-14 LAB — POCT URINE PREGNANCY: PREG TEST UR: NEGATIVE

## 2013-07-14 MED ORDER — CEFTRIAXONE SODIUM 1 G IJ SOLR
1.0000 g | Freq: Once | INTRAMUSCULAR | Status: AC
  Administered 2013-07-14: 1 g via INTRAMUSCULAR

## 2013-07-14 NOTE — Progress Notes (Signed)
Subjective:     Patient ID: [name], female   DOB: Apr 16, 1981, 33 y.o.   MRN: [id number]  HPI [name] is a 33 year old white female,married, new to this practice, in complaining of severe abdominal pain x 2-3 days and low grade temp that comes and goes.Denies any nausea,vomiting or diarrhea.No UTI symptoms. No back pain. On doxy for acne.Had Essure 4 years ago.Has Factor 5 Leiden,heterozygous.Does not have appendix.  Review of Systems See HPI Reviewed past medical,surgical, social and family history. Reviewed medications and allergies.     Objective:   Physical Exam BP 120/84  Temp(Src) 98.4 F (36.9 C)  Ht '5\' 4"'  (1.626 m)  Wt 177 lb (80.287 kg)  BMI 30.37 kg/m2  LMP 02/25/2014UPT negative,Skin warm and dry.Pelvic: external genitalia is normal in appearance, vagina: scant discharge without odor, cervix:smooth and bulbous, uterus: normal size, shape and contour,  tender, no masses felt, adnexa: no masses, has tenderness over ovary area.No rebound tenderness noted,No pain in upper quadrants with palpation.Urine dipstick trace blood and trace protein and cloudy. GC/CHL obtained.    Discussed with Dr Elonda Husky.  Assessment:     Pelvic pain Hematuria     Plan:     Return in 1 day for [place] and see me Check CBC,CMP,ESR, HIV, GC/CHL and UA C&S  Rocephin 1 gm IM in office Continue taking doxycycline BID If pain increases, has vomiting or fever over 101 go to ER Review handout on pelvic pain

## 2013-07-14 NOTE — Patient Instructions (Signed)
Pelvic Pain, Female Female pelvic pain can be caused by many different things and start from a variety of places. Pelvic pain refers to pain that is located in the lower half of the abdomen and between your hips. The pain may occur over a short period of time (acute) or may be reoccurring (chronic). The cause of pelvic pain may be related to disorders affecting the female reproductive organs (gynecologic), but it may also be related to the bladder, kidney stones, an intestinal complication, or muscle or skeletal problems. Getting help right away for pelvic pain is important, especially if there has been severe, sharp, or a sudden onset of unusual pain. It is also important to get help right away because some types of pelvic pain can be life threatening.  CAUSES  Below are only some of the causes of pelvic pain. The causes of pelvic pain can be in one of several categories.   Gynecologic.  Pelvic inflammatory disease.  Sexually transmitted infection.  Ovarian cyst or a twisted ovarian ligament (ovarian torsion).  Uterine lining that grows outside the uterus (endometriosis).  Fibroids, cysts, or tumors.  Ovulation.  Pregnancy.  Pregnancy that occurs outside the uterus (ectopic pregnancy).  Miscarriage.  Labor.  Abruption of the placenta or ruptured uterus.  Infection.  Uterine infection (endometritis).  Bladder infection.  Diverticulitis.  Miscarriage related to a uterine infection (septic abortion).  Bladder.  Inflammation of the bladder (cystitis).  Kidney stone(s).  Gastrointenstinal.  Constipation.  Diverticulitis.  Neurologic.  Trauma.  Feeling pelvic pain because of mental or emotional causes (psychosomatic).  Cancers of the bowel or pelvis. EVALUATION  Your caregiver will want to take a careful history of your concerns. This includes recent changes in your health, a careful gynecologic history of your periods (menses), and a sexual history. Obtaining  your family history and medical history is also important. Your caregiver may suggest a pelvic exam. A pelvic exam will help identify the location and severity of the pain. It also helps in the evaluation of which organ system may be involved. In order to identify the cause of the pelvic pain and be properly treated, your caregiver may order tests. These tests may include:   A pregnancy test.  Pelvic ultrasonography.  An X-ray exam of the abdomen.  A urinalysis or evaluation of vaginal discharge.  Blood tests. HOME CARE INSTRUCTIONS   Only take over-the-counter or prescription medicines for pain, discomfort, or fever as directed by your caregiver.   Rest as directed by your caregiver.   Eat a balanced diet.   Drink enough fluids to make your urine clear or pale yellow, or as directed.   Avoid sexual intercourse if it causes pain.   Apply warm or cold compresses to the lower abdomen depending on which one helps the pain.   Avoid stressful situations.   Keep a journal of your pelvic pain. Write down when it started, where the pain is located, and if there are things that seem to be associated with the pain, such as food or your menstrual cycle.  Follow up with your caregiver as directed.  SEEK MEDICAL CARE IF:  Your medicine does not help your pain.  You have abnormal vaginal discharge. SEEK IMMEDIATE MEDICAL CARE IF:   You have heavy bleeding from the vagina.   Your pelvic pain increases.   You feel lightheaded or faint.   You have chills.   You have pain with urination or blood in your urine.   You have uncontrolled  diarrhea or vomiting.   You have a fever or persistent symptoms for more than 3 days.  You have a fever and your symptoms suddenly get worse.   You are being physically or sexually abused.  MAKE SURE YOU:  Understand these instructions.  Will watch your condition.  Will get help if you are not doing well or get worse. Document  Released: 03/26/2004 Document Revised: 10/29/2011 Document Reviewed: 08/19/2011 [employer] Patient Information 2014 [place], [place]. Return in 1 day for US If increase in pain or vomiting, or fever over 101 go to ER

## 2013-07-15 ENCOUNTER — Other Ambulatory Visit: Payer: Self-pay | Admitting: Adult Health

## 2013-07-15 ENCOUNTER — Telehealth: Payer: Self-pay | Admitting: Adult Health

## 2013-07-15 ENCOUNTER — Ambulatory Visit: Payer: BC Managed Care – PPO | Admitting: Adult Health

## 2013-07-15 ENCOUNTER — Other Ambulatory Visit: Payer: BC Managed Care – PPO

## 2013-07-15 DIAGNOSIS — N949 Unspecified condition associated with female genital organs and menstrual cycle: Secondary | ICD-10-CM

## 2013-07-15 LAB — GC/CHLAMYDIA PROBE AMP
CT Probe RNA: NEGATIVE
GC PROBE AMP APTIMA: NEGATIVE

## 2013-07-15 LAB — URINALYSIS
BILIRUBIN URINE: NEGATIVE
Glucose, UA: NEGATIVE mg/dL
Ketones, ur: NEGATIVE mg/dL
LEUKOCYTES UA: NEGATIVE
NITRITE: NEGATIVE
PROTEIN: NEGATIVE mg/dL
SPECIFIC GRAVITY, URINE: 1.017 (ref 1.005–1.030)
UROBILINOGEN UA: 0.2 mg/dL (ref 0.0–1.0)
pH: 7 (ref 5.0–8.0)

## 2013-07-15 LAB — HIV ANTIBODY (ROUTINE TESTING W REFLEX): HIV: NONREACTIVE

## 2013-07-15 NOTE — Telephone Encounter (Signed)
Pt feels better, and is aware of labs, has appt next week for US had to cancel today

## 2013-07-16 LAB — URINE CULTURE
COLONY COUNT: NO GROWTH
ORGANISM ID, BACTERIA: NO GROWTH

## 2013-07-21 ENCOUNTER — Other Ambulatory Visit: Payer: BC Managed Care – PPO

## 2013-07-23 ENCOUNTER — Other Ambulatory Visit: Payer: Self-pay | Admitting: Adult Health

## 2013-07-23 ENCOUNTER — Ambulatory Visit (INDEPENDENT_AMBULATORY_CARE_PROVIDER_SITE_OTHER): Payer: BC Managed Care – PPO | Admitting: Adult Health

## 2013-07-23 ENCOUNTER — Encounter: Payer: Self-pay | Admitting: Adult Health

## 2013-07-23 ENCOUNTER — Ambulatory Visit (INDEPENDENT_AMBULATORY_CARE_PROVIDER_SITE_OTHER): Payer: BC Managed Care – PPO

## 2013-07-23 VITALS — BP 138/90 | Ht 64.0 in | Wt 182.0 lb

## 2013-07-23 DIAGNOSIS — N736 Female pelvic peritoneal adhesions (postinfective): Secondary | ICD-10-CM

## 2013-07-23 DIAGNOSIS — N949 Unspecified condition associated with female genital organs and menstrual cycle: Secondary | ICD-10-CM

## 2013-07-23 NOTE — Progress Notes (Signed)
Subjective:     Patient ID: [name], female   DOB: 11-09-1980, 33 y.o.   MRN: [id number]  HPI [name] is back for US for pelvic pain and she feels better, just sore.Pt aware labs normal.  Review of Systems See HPI Reviewed past medical,surgical, social and family history. Reviewed medications and allergies.     Objective:   Physical Exam BP 138/90  Ht 5\' 4"  (1.626 m)  Wt 182 lb (82.555 kg)  BMI 31.22 kg/m2  LMP 07/07/2012   Reviewed US with pt Uterus 9.9 x 6.3 x 5.4 cm, no myometrial masses noted  Endometrium 8.2 mm, symmetrical,  Right ovary 3.1 x 2.4 x 1.5 cm, located adjacent to uterus and unable to separate the ovary from the uterus with abdominal pressure applied (?adhered?)  Left ovary 2.8 x 1.9 x 1.8 cm,  No free fluid or adnexal masses noted within pelvis  Technician Comments:  Anteverted uterus noted, no myometrial masses noted, Endom-8.582mm symmetrical, Lt ovary appears wnl, Rt ovary appears ?"adhered" to the uterine fundus (Pt c/o pain with abdominal pressure applied)   Assessment:     Pelvic pain Pelvis adhesions     Plan:     Return in 1 week to see Dr Emelda FearFerguson to discuss options of treatment Review handout on adhesions

## 2013-07-23 NOTE — Patient Instructions (Signed)
Adhesions Adhesions are stringy (fibrous) bands of tissue. Adhesions are similar to scars, but they are on the inside of your body. Adhesions form between two surfaces of the body. CAUSES   The most common adhesions are those that occur following surgery. Touching and moving things within the belly (abdomen) leads to inflammation and adhesions can form. Not all people get adhesions following surgery. But, adhesions may happen even with the gentlest handling of the organs in the abdomen. There is no way to predict who will have adhesions. The adhesions can occur between:  Loops of bowel.  The bowel and other organs such as the liver.  The contents in the pelvis such as the uterus, bladder, ovaries and tubes.  Inflammation within the abdomen even if there is no surgery. An example would be an infection in the tubes and uterus (pelvic inflammatory disease). Any infection will cause inflammation that may lead to adhesions.  Radiation treatment. SYMPTOMS  Many people have no signs or symptoms from adhesions. However, adhesions may cause a number of symptoms. Some of these are:  Abdominal pain, tenderness or cramping.  Abdominal bloating.  Constipation or diarrhea.  Vomiting.  Painful sex.  In females, difficulty getting pregnant. DIAGNOSIS   An exam by your caregiver may suggest that adhesions are present.  A surgical procedure using a small incision and a thin scope can be used to look inside the abdomen at the adhesions.  Sometimes x-rays may suggest adhesions inside the abdomen. TREATMENT  Surgery can be performed to separate the adhesions. This often takes care of the problems caused by the adhesions, although surgery may also cause more adhesions to develop. PROGNOSIS   The outcome is usually good if an operation is used to fix adhesions inside the belly.  All adhesions may reoccur and the problem can come back. HOME CARE INSTRUCTIONS   Take usual medications as directed  by your caregiver.  Only take over-the-counter or prescription medicines for pain, discomfort or fever as directed by your caregiver.  Pay attention to the pain:  Has it changed?  Has it moved?  Is it gone?  Do not eat solid food until your pain is gone.  While you have pain: Stay on a clear liquid diet. A clear liquid is one you can see through (water, weak tea, broth or bouillon, lemon lime carbonated drinks, gelatin, popsicles or ice chips).  When your pain is gone: Start a light diet (dry toast, crackers, applesauce, white rice, bananas, broth or bouillon). Increase the diet slowly as long as it does not bother you. No dairy products (including cheese and eggs) and no spicy, fatty, fried or high fiber foods. Your caregiver will tell you if you should be on a special diet.  No alcohol, caffeine or cigarettes.  If your caregiver has given you a follow-up appointment, it is very important to keep that appointment. Not keeping the appointment could result in a permanent injury or lasting (chronic) pain or disability. SEEK IMMEDIATE MEDICAL CARE IF:   Your pain is not gone in 24 hours.  Your pain becomes worse, changes location or feels different.  You have a fever.  Your vomiting will not stop.  You have blood or brown flecks (like coffee grounds) in your vomit.  You have blood in your bowel movements.  Your bowel movements are dark or black.  Your bowel movements stop (become blocked) or you can not pass gas. Document Released: 07/20/2003 Document Revised: 07/22/2011 Document Reviewed: 02/17/2009 [employer] Patient Information  2014 [place], [place]. Return in 1 week to talk with Dr Emelda Fearferguson

## 2013-07-30 ENCOUNTER — Ambulatory Visit: Payer: BC Managed Care – PPO | Admitting: Obstetrics and Gynecology

## 2014-03-14 ENCOUNTER — Encounter: Payer: Self-pay | Admitting: Adult Health

## 2014-03-30 ENCOUNTER — Ambulatory Visit (INDEPENDENT_AMBULATORY_CARE_PROVIDER_SITE_OTHER): Payer: BC Managed Care – PPO | Admitting: Emergency Medicine

## 2014-03-30 VITALS — BP 160/102 | HR 96 | Temp 99.0°F | Resp 18 | Ht 63.75 in | Wt 162.6 lb

## 2014-03-30 DIAGNOSIS — Z87440 Personal history of urinary (tract) infections: Secondary | ICD-10-CM

## 2014-03-30 DIAGNOSIS — R42 Dizziness and giddiness: Secondary | ICD-10-CM

## 2014-03-30 DIAGNOSIS — I10 Essential (primary) hypertension: Secondary | ICD-10-CM

## 2014-03-30 DIAGNOSIS — N3000 Acute cystitis without hematuria: Secondary | ICD-10-CM

## 2014-03-30 DIAGNOSIS — R8290 Unspecified abnormal findings in urine: Secondary | ICD-10-CM

## 2014-03-30 LAB — POCT URINALYSIS DIPSTICK
Bilirubin, UA: NEGATIVE
Glucose, UA: NEGATIVE
Ketones, UA: NEGATIVE
NITRITE UA: POSITIVE
SPEC GRAV UA: 1.02
UROBILINOGEN UA: 0.2
pH, UA: 6.5

## 2014-03-30 LAB — COMPREHENSIVE METABOLIC PANEL
ALK PHOS: 76 U/L (ref 39–117)
ALT: 14 U/L (ref 0–35)
AST: 12 U/L (ref 0–37)
Albumin: 4 g/dL (ref 3.5–5.2)
BILIRUBIN TOTAL: 0.7 mg/dL (ref 0.2–1.2)
BUN: 11 mg/dL (ref 6–23)
CO2: 22 mEq/L (ref 19–32)
CREATININE: 0.79 mg/dL (ref 0.50–1.10)
Calcium: 8.6 mg/dL (ref 8.4–10.5)
Chloride: 109 mEq/L (ref 96–112)
Glucose, Bld: 96 mg/dL (ref 70–99)
Potassium: 3.5 mEq/L (ref 3.5–5.3)
Sodium: 140 mEq/L (ref 135–145)
Total Protein: 6.6 g/dL (ref 6.0–8.3)

## 2014-03-30 LAB — CBC WITH DIFFERENTIAL/PLATELET
BASOS ABS: 0 10*3/uL (ref 0.0–0.1)
BASOS PCT: 0 % (ref 0–1)
EOS PCT: 1 % (ref 0–5)
Eosinophils Absolute: 0.1 10*3/uL (ref 0.0–0.7)
HCT: 39.8 % (ref 36.0–46.0)
Hemoglobin: 13.7 g/dL (ref 12.0–15.0)
Lymphocytes Relative: 19 % (ref 12–46)
Lymphs Abs: 2.2 10*3/uL (ref 0.7–4.0)
MCH: 29.2 pg (ref 26.0–34.0)
MCHC: 34.4 g/dL (ref 30.0–36.0)
MCV: 84.9 fL (ref 78.0–100.0)
MONO ABS: 1 10*3/uL (ref 0.1–1.0)
MPV: 9.7 fL (ref 9.4–12.4)
Monocytes Relative: 9 % (ref 3–12)
Neutro Abs: 8.2 10*3/uL — ABNORMAL HIGH (ref 1.7–7.7)
Neutrophils Relative %: 71 % (ref 43–77)
PLATELETS: 263 10*3/uL (ref 150–400)
RBC: 4.69 MIL/uL (ref 3.87–5.11)
RDW: 13.6 % (ref 11.5–15.5)
WBC: 11.5 10*3/uL — AB (ref 4.0–10.5)

## 2014-03-30 LAB — POCT UA - MICROSCOPIC ONLY
CASTS, UR, LPF, POC: NEGATIVE
CRYSTALS, UR, HPF, POC: NEGATIVE
Mucus, UA: NEGATIVE
YEAST UA: NEGATIVE

## 2014-03-30 MED ORDER — PHENAZOPYRIDINE HCL 200 MG PO TABS: 200.0000 mg | ORAL_TABLET | Freq: Three times a day (TID) | ORAL | Status: DC | PRN

## 2014-03-30 MED ORDER — NITROFURANTOIN MONOHYD MACRO 100 MG PO CAPS: 100.0000 mg | ORAL_CAPSULE | Freq: Two times a day (BID) | ORAL | Status: DC

## 2014-03-30 MED ORDER — LISINOPRIL 10 MG PO TABS: 10.0000 mg | ORAL_TABLET | Freq: Every day | ORAL | Status: DC

## 2014-03-30 NOTE — Patient Instructions (Signed)

## 2014-03-30 NOTE — Progress Notes (Signed)
[facility] [address], [place] [id number] [phone]  Date:  03/30/2014   Name:  Tanya FeltsMelissa J [name]   DOB:  [birth year]   MRN:  [id number]  PCP:  Cassandra Mcmanaman,CHELLE, PA-C    Chief Complaint: Dizziness and Back Pain   History of Present Illness:  [name] is a 33 y.o. very pleasant female patient who presents with the following:  Says dizzy intermittently for a week.  Worse today.  Saw her FMD Monday and was told her blood count was a "little low"  She is working nights and not sleeping during the day Was on antihypertensive and stopped as she lost weight. Has factor 5 leiden.  Bleeding today from nose and says menses present for 14 days Denies headache or neuro or visual symptoms. No cough, coryza, fever chills, nausea or vomiting or stoool change No improvement with over the counter medications or other home remedies.  Denies other complaint or health concern today.   Patient Active Problem List   Diagnosis Date Noted  . Pelvic adhesions 07/23/2013  . Unspecified symptom associated with female genital organs 07/14/2013  . Factor 5 Leiden mutation, heterozygous 05/14/2013    Past Medical History  Diagnosis Date  . Hypertension   . Blood dyscrasia     Leiden Factor V- diagnosed 2010   . GERD (gastroesophageal reflux disease)   . Chronic kidney disease     left ureteral stone   . Gallstones   . Factor V Leiden   . Pneumonia   . Unspecified symptom associated with female genital organs 07/14/2013  . Pelvic adhesions 07/23/2013    Past Surgical History  Procedure Laterality Date  . Appendectomy    . Dilation and curettage of uterus    . Other surgical history  2010     Essure sterilization procedure   . Cystoscopy/retrograde/ureteroscopy/stone extraction with basket  04/05/2011    Procedure: CYSTOSCOPY/RETROGRADE/URETEROSCOPY/STONE EXTRACTION WITH BASKET;  Surgeon: Kathi LudwigSigmund I Tannenbaum, MD;  Location: [facility];  Service: Urology;  Laterality:  Left;   cysto, left retrograde, left ureteroscopy, left ureteral stone basketry, placement of double j stent left ureter    . Cystoscopy w/ ureteral stent placement  04/05/2011    Procedure: CYSTOSCOPY WITH STENT REPLACEMENT;  Surgeon: Kathi LudwigSigmund I Tannenbaum, MD;  Location: [facility];  Service: Urology;  Laterality: Left;  . Tubal ligation    . Essure tubal ligation      History  Substance Use Topics  . Smoking status: Never Smoker   . Smokeless tobacco: Never Used  . Alcohol Use: No    Family History  Problem Relation Age of Onset  . Diabetes Father   . Hypertension Father   . Hypertension Mother   . Hypertension Maternal Grandmother   . Hypertension Maternal Grandfather   . Hypertension Paternal Grandmother   . Hypertension Paternal Grandfather   . Asthma Sister   . Asthma Brother   . Asthma Daughter   . Asthma Son     Allergies  Allergen Reactions  . Augmentin [Amoxicillin-Pot Clavulanate] Swelling  . Azithromycin     Swelling and hives  . Penicillins Swelling and Rash    Medication list has been reviewed and updated.  Current Outpatient Prescriptions on File Prior to Visit  Medication Sig Dispense Refill  . doxycycline (VIBRA-TABS) 100 MG tablet Take 1 tablet (100 mg total) by mouth 2 (two) times daily. 20 tablet 0  . EPINEPHrine (EPIPEN) 0.3 mg/0.3 mL SOAJ injection Inject 0.3  mLs (0.3 mg total) into the muscle once. 1 Device 2  . olmesartan (BENICAR) 40 MG tablet Take 1 tablet (40 mg total) by mouth every morning. 30 tablet 5  . omeprazole (PRILOSEC) 20 MG capsule Take 20 mg by mouth daily.      No current facility-administered medications on file prior to visit.    Review of Systems:  As per HPI, otherwise negative.    Physical Examination: Filed Vitals:   03/30/14 1608  BP: 160/102  Pulse: 96  Temp: 99 F (37.2 C)  Resp: 18   Filed Vitals:   03/30/14 1608  Height: 5' 3.75" (1.619 m)  Weight: 162 lb 9.6 oz (73.755 kg)   Body mass index is 28.14  kg/(m^2). Ideal Body Weight: Weight in (lb) to have BMI = 25: 144.2  GEN: WDWN, NAD, Non-toxic, A & O x 3 HEENT: Atraumatic, Normocephalic. Neck supple. No masses, No LAD. Ears and Nose: No external deformity. CV: RRR, No M/G/R. No JVD. No thrill. No extra heart sounds. PULM: CTA B, no wheezes, crackles, rhonchi. No retractions. No resp. distress. No accessory muscle use. ABD: S, NT, ND, +BS. No rebound. No HSM. EXTR: No c/c/e NEURO Normal gait.  PSYCH: Normally interactive. Conversant. Not depressed or anxious appearing.  Calm demeanor.    Assessment and Plan: Cystitis macrobid Pyridium Hypertension Lisinopril Labs pending  Signed,  Phillips OdorJeffery Cullan Launer, MD   Results for orders placed or performed in visit on 03/30/14  POCT UA - Microscopic Only  Result Value Ref Range   WBC, Ur, HPF, POC tntc    RBC, urine, microscopic 1-4    Bacteria, U Microscopic 4+    Mucus, UA neg    Epithelial cells, urine per micros 4-8    Crystals, Ur, HPF, POC neg    Casts, Ur, LPF, POC neg    Yeast, UA neg   POCT urinalysis dipstick  Result Value Ref Range   Color, UA yellow    Clarity, UA cloudy    Glucose, UA neg    Bilirubin, UA neg    Ketones, UA neg    Spec Grav, UA 1.020    Blood, UA small    pH, UA 6.5    Protein, UA trace    Urobilinogen, UA 0.2    Nitrite, UA positive    Leukocytes, UA moderate (2+)

## 2014-04-02 LAB — URINE CULTURE: Colony Count: >=100000

## 2014-04-04 ENCOUNTER — Telehealth: Payer: Self-pay | Admitting: *Deleted

## 2014-04-04 NOTE — Telephone Encounter (Signed)
Come in for a bp check

## 2014-04-04 NOTE — Telephone Encounter (Signed)
Pt wanted to know lab results advised her that bloodwork looked good and that she did have an UTI and that the medication that she is on should take care of it and she should finish it.  She also stated that since starting on the new blood pressure medication lisinopril she feels lightheaded, confused, and sometimes numbness the first 30 min of taking medication.  Please advise?

## 2014-04-06 ENCOUNTER — Ambulatory Visit (INDEPENDENT_AMBULATORY_CARE_PROVIDER_SITE_OTHER): Payer: BC Managed Care – PPO | Admitting: Emergency Medicine

## 2014-04-06 VITALS — BP 128/80 | HR 88 | Temp 97.8°F | Resp 16 | Ht 64.0 in | Wt 162.6 lb

## 2014-04-06 DIAGNOSIS — Z3200 Encounter for pregnancy test, result unknown: Secondary | ICD-10-CM

## 2014-04-06 DIAGNOSIS — R079 Chest pain, unspecified: Secondary | ICD-10-CM

## 2014-04-06 DIAGNOSIS — R82998 Other abnormal findings in urine: Secondary | ICD-10-CM

## 2014-04-06 DIAGNOSIS — R110 Nausea: Secondary | ICD-10-CM

## 2014-04-06 DIAGNOSIS — Z3202 Encounter for pregnancy test, result negative: Secondary | ICD-10-CM

## 2014-04-06 DIAGNOSIS — Z87440 Personal history of urinary (tract) infections: Secondary | ICD-10-CM

## 2014-04-06 DIAGNOSIS — R42 Dizziness and giddiness: Secondary | ICD-10-CM

## 2014-04-06 DIAGNOSIS — N390 Urinary tract infection, site not specified: Secondary | ICD-10-CM

## 2014-04-06 DIAGNOSIS — F419 Anxiety disorder, unspecified: Secondary | ICD-10-CM

## 2014-04-06 LAB — POCT URINALYSIS DIPSTICK
BILIRUBIN UA: NEGATIVE
GLUCOSE UA: NEGATIVE
Ketones, UA: NEGATIVE
NITRITE UA: NEGATIVE
PH UA: 7
Protein, UA: 30
Spec Grav, UA: 1.02
Urobilinogen, UA: 0.2

## 2014-04-06 LAB — POCT URINE PREGNANCY: Preg Test, Ur: NEGATIVE

## 2014-04-06 MED ORDER — ONDANSETRON 8 MG PO TBDP: ORAL_TABLET | ORAL | Status: DC

## 2014-04-06 MED ORDER — DILTIAZEM HCL ER COATED BEADS 180 MG PO CP24: 180.0000 mg | ORAL_CAPSULE | Freq: Every day | ORAL | Status: DC

## 2014-04-06 NOTE — Progress Notes (Addendum)
   Subjective:    Patient ID: [name], female    DOB: 05/24/1980, 33 y.o.   MRN: [id number] This chart was scribed for Lesle ChrisSteven Gatlin Kittell, MD by Jolene Provostobert Halas, Medical Scribe. This patient was seen in Room 14 and the patient's care was started at 1:42 PM.  Chief Complaint  Patient presents with  . Dizziness  . Numbness    in arms the forst 30 min after taking medication    HPI HPI Comments: [name] is a 33 y.o. female diagnosed with Factor V Leiden (diagnosed 2010), chronic kidney disease (left ureteral stone), and HTN  who [facility] complaining of dizziness and light headedness that started one week ago. Pt endorses associated nausea and vomiting. Pt has a past surgical hx of appendectomy, essure sterilization procedure, and kidney stone removal. Pt states she changed her blood pressure medication, and was [facility]/2014. 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 Pt has three children.   Review of Systems  Constitutional: Positive for chills.  Gastrointestinal: Positive for nausea and vomiting.  Neurological: Positive for dizziness.       Objective:   Physical Exam  Constitutional: She is oriented to person, place, and time. She appears well-developed and well-nourished.  HENT:  Head: Normocephalic and atraumatic.  Eyes: Pupils are equal, round, and reactive to light.  Neck: Neck supple.  Cardiovascular: Normal rate and regular rhythm.   Pulmonary/Chest: Effort normal and breath sounds normal. No respiratory distress.  Neurological: She is alert and oriented to person, place, and time.  Skin: Skin is warm and dry.  Psychiatric: She has a normal mood and affect. Her behavior is normal.  Nursing note and vitals reviewed.  Results for orders placed or performed in visit on 04/06/14  POCT urinalysis dipstick  Result Value Ref Range   Color,  UA yellow    Clarity, UA cloudy    Glucose, UA neg    Bilirubin, UA neg    Ketones, UA neg    Spec Grav, UA 1.020    Blood, UA large    pH, UA 7.0    Protein, UA 30    Urobilinogen, UA 0.2    Nitrite, UA neg    Leukocytes, UA small (1+)   POCT urine pregnancy  Result Value Ref Range   Preg Test, Ur Negative        Assessment & Plan:  She is currently on her menses. Advised her to finish her Macrobid. We'll try Cardizem sustained release 180 mg one every morning. She can start this Friday or Saturday. She is to stop the lisinopril

## 2014-04-08 LAB — URINE CULTURE
COLONY COUNT: NO GROWTH
ORGANISM ID, BACTERIA: NO GROWTH

## 2014-04-28 NOTE — Telephone Encounter (Signed)
Pt was seen on 11/25

## 2014-04-30 ENCOUNTER — Emergency Department (HOSPITAL_COMMUNITY)
Admission: EM | Admit: 2014-04-30 | Discharge: 2014-04-30 | Disposition: A | Discharge status: Home / Self Care | Payer: BC Managed Care – PPO | Attending: Emergency Medicine | Admitting: Emergency Medicine

## 2014-04-30 ENCOUNTER — Encounter (HOSPITAL_COMMUNITY): Payer: Self-pay | Admitting: Emergency Medicine

## 2014-04-30 DIAGNOSIS — N189 Chronic kidney disease, unspecified: Secondary | ICD-10-CM | POA: Insufficient documentation

## 2014-04-30 DIAGNOSIS — Z880 Allergy status to penicillin: Secondary | ICD-10-CM | POA: Diagnosis not present

## 2014-04-30 DIAGNOSIS — Z79899 Other long term (current) drug therapy: Secondary | ICD-10-CM | POA: Insufficient documentation

## 2014-04-30 DIAGNOSIS — I129 Hypertensive chronic kidney disease with stage 1 through stage 4 chronic kidney disease, or unspecified chronic kidney disease: Secondary | ICD-10-CM | POA: Diagnosis not present

## 2014-04-30 DIAGNOSIS — Z8701 Personal history of pneumonia (recurrent): Secondary | ICD-10-CM | POA: Insufficient documentation

## 2014-04-30 DIAGNOSIS — Z792 Long term (current) use of antibiotics: Secondary | ICD-10-CM | POA: Insufficient documentation

## 2014-04-30 DIAGNOSIS — R079 Chest pain, unspecified: Secondary | ICD-10-CM | POA: Insufficient documentation

## 2014-04-30 DIAGNOSIS — T887XXA Unspecified adverse effect of drug or medicament, initial encounter: Secondary | ICD-10-CM | POA: Insufficient documentation

## 2014-04-30 DIAGNOSIS — Z862 Personal history of diseases of the blood and blood-forming organs and certain disorders involving the immune mechanism: Secondary | ICD-10-CM | POA: Diagnosis not present

## 2014-04-30 DIAGNOSIS — T7840XA Allergy, unspecified, initial encounter: Secondary | ICD-10-CM

## 2014-04-30 DIAGNOSIS — K219 Gastro-esophageal reflux disease without esophagitis: Secondary | ICD-10-CM | POA: Insufficient documentation

## 2014-04-30 MED ORDER — DIPHENHYDRAMINE HCL 25 MG PO CAPS
25.0000 mg | ORAL_CAPSULE | Freq: Once | ORAL | Status: AC
  Administered 2014-04-30: 25 mg via ORAL
  Filled 2014-04-30: qty 1

## 2014-04-30 MED ORDER — FAMOTIDINE 20 MG PO TABS
20.0000 mg | ORAL_TABLET | Freq: Once | ORAL | Status: AC
  Administered 2014-04-30: 20 mg via ORAL
  Filled 2014-04-30: qty 1

## 2014-04-30 MED ORDER — PREDNISONE 50 MG PO TABS: 50.0000 mg | ORAL_TABLET | Freq: Every day | ORAL | Status: DC

## 2014-04-30 MED ORDER — PREDNISONE 50 MG PO TABS
60.0000 mg | ORAL_TABLET | Freq: Once | ORAL | Status: AC
  Administered 2014-04-30: 60 mg via ORAL
  Filled 2014-04-30 (×2): qty 1

## 2014-04-30 NOTE — Discharge Instructions (Signed)
Stop taking Doxycycline. Take Claritin or Zytec once a day for the next five days. Take Benadryl as needed.   Allergies Allergies may happen from anything your body is sensitive to. This may be food, medicines, pollens, chemicals, and nearly anything around you in everyday life that produces allergens. An allergen is anything that causes an allergy producing substance. Heredity is often a factor in causing these problems. This means you may have some of the same allergies as your parents. Food allergies happen in all age groups. Food allergies are some of the most severe and life threatening. Some common food allergies are cow's milk, seafood, eggs, nuts, wheat, and soybeans. SYMPTOMS   Swelling around the mouth.  An itchy red rash or hives.  Vomiting or diarrhea.  Difficulty breathing. SEVERE ALLERGIC REACTIONS ARE LIFE-THREATENING. This reaction is called anaphylaxis. It can cause the mouth and throat to swell and cause difficulty with breathing and swallowing. In severe reactions only a trace amount of food (for example, peanut oil in a salad) may cause death within seconds. Seasonal allergies occur in all age groups. These are seasonal because they usually occur during the same season every year. They may be a reaction to molds, grass pollens, or tree pollens. Other causes of problems are house dust mite allergens, pet dander, and mold spores. The symptoms often consist of nasal congestion, a runny itchy nose associated with sneezing, and tearing itchy eyes. There is often an associated itching of the mouth and ears. The problems happen when you come in contact with pollens and other allergens. Allergens are the particles in the air that the body reacts to with an allergic reaction. This causes you to release allergic antibodies. Through a chain of events, these eventually cause you to release histamine into the blood stream. Although it is meant to be protective to the body, it is this release  that causes your discomfort. This is why you were given anti-histamines to feel better. If you are unable to pinpoint the offending allergen, it may be determined by skin or blood testing. Allergies cannot be cured but can be controlled with medicine. Hay fever is a collection of all or some of the seasonal allergy problems. It may often be treated with simple over-the-counter medicine such as diphenhydramine. Take medicine as directed. Do not drink alcohol or drive while taking this medicine. Check with your caregiver or package insert for child dosages. If these medicines are not effective, there are many new medicines your caregiver can prescribe. Stronger medicine such as nasal spray, eye drops, and corticosteroids may be used if the first things you try do not work well. Other treatments such as immunotherapy or desensitizing injections can be used if all else fails. Follow up with your caregiver if problems continue. These seasonal allergies are usually not life threatening. They are generally more of a nuisance that can often be handled using medicine. HOME CARE INSTRUCTIONS   If unsure what causes a reaction, keep a diary of foods eaten and symptoms that follow. Avoid foods that cause reactions.  If hives or rash are present:  Take medicine as directed.  You may use an over-the-counter antihistamine (diphenhydramine) for hives and itching as needed.  Apply cold compresses (cloths) to the skin or take baths in cool water. Avoid hot baths or showers. Heat will make a rash and itching worse.  If you are severely allergic:  Following a treatment for a severe reaction, hospitalization is often required for closer follow-up.  Wear  a medic-alert bracelet or necklace stating the allergy.  You and your family must learn how to give adrenaline or use an anaphylaxis kit.  If you have had a severe reaction, always carry your anaphylaxis kit or EpiPen with you. Use this medicine as directed by  your caregiver if a severe reaction is occurring. Failure to do so could have a fatal outcome. SEEK MEDICAL CARE IF:  You suspect a food allergy. Symptoms generally happen within 30 minutes of eating a food.  Your symptoms have not gone away within 2 days or are getting worse.  You develop new symptoms.  You want to retest yourself or your child with a food or drink you think causes an allergic reaction. Never do this if an anaphylactic reaction to that food or drink has happened before. Only do this under the care of a caregiver. SEEK IMMEDIATE MEDICAL CARE IF:   You have difficulty breathing, are wheezing, or have a tight feeling in your chest or throat.  You have a swollen mouth, or you have hives, swelling, or itching all over your body.  You have had a severe reaction that has responded to your anaphylaxis kit or an EpiPen. These reactions may return when the medicine has worn off. These reactions should be considered life threatening. MAKE SURE YOU:   Understand these instructions.  Will watch your condition.  Will get help right away if you are not doing well or get worse. Document Released: 07/23/2002 Document Revised: 08/24/2012 Document Reviewed: 12/28/2007 [facility] Patient Information 2015 [place], [place]. This information is not intended to replace advice given to you by your health care provider. Make sure you discuss any questions you have with your health care provider.  Prednisone tablets What is this medicine? PREDNISONE (PRED ni sone) is a corticosteroid. It is commonly used to treat inflammation of the skin, joints, lungs, and other organs. Common conditions treated include asthma, allergies, and arthritis. It is also used for other conditions, such as blood disorders and diseases of the adrenal glands. This medicine may be used for other purposes; ask your health care provider or pharmacist if you have questions. COMMON BRAND NAME(S): Deltasone, Predone, Sterapred,  Sterapred DS What should I tell my health care provider before I take this medicine? They need to know if you have any of these conditions: -Cushing's syndrome -diabetes -glaucoma -heart disease -high blood pressure -infection (especially a virus infection such as chickenpox, cold sores, or herpes) -kidney disease -liver disease -mental illness -myasthenia gravis -osteoporosis -seizures -stomach or intestine problems -thyroid disease -an unusual or allergic reaction to lactose, prednisone, other medicines, foods, dyes, or preservatives -pregnant or trying to get pregnant -breast-feeding How should I use this medicine? Take this medicine by mouth with a glass of water. Follow the directions on the prescription label. Take this medicine with food. If you are taking this medicine once a day, take it in the morning. Do not take more medicine than you are told to take. Do not suddenly stop taking your medicine because you may develop a severe reaction. Your doctor will tell you how much medicine to take. If your doctor wants you to stop the medicine, the dose may be slowly lowered over time to avoid any side effects. Talk to your pediatrician regarding the use of this medicine in children. Special care may be needed. Overdosage: If you think you have taken too much of this medicine contact a poison control center or emergency room at once. NOTE: This medicine is  only for you. Do not share this medicine with others. What if I miss a dose? If you miss a dose, take it as soon as you can. If it is almost time for your next dose, talk to your doctor or health care professional. You may need to miss a dose or take an extra dose. Do not take double or extra doses without advice. What may interact with this medicine? Do not take this medicine with any of the following medications: -metyrapone -mifepristone This medicine may also interact with the following  medications: -aminoglutethimide -amphotericin B -aspirin and aspirin-like medicines -barbiturates -certain medicines for diabetes, like glipizide or glyburide -cholestyramine -cholinesterase inhibitors -cyclosporine -digoxin -diuretics -ephedrine -female hormones, like estrogens and birth control pills -isoniazid -ketoconazole -NSAIDS, medicines for pain and inflammation, like ibuprofen or naproxen -phenytoin -rifampin -toxoids -vaccines -warfarin This list may not describe all possible interactions. Give your health care provider a list of all the medicines, herbs, non-prescription drugs, or dietary supplements you use. Also tell them if you smoke, drink alcohol, or use illegal drugs. Some items may interact with your medicine. What should I watch for while using this medicine? Visit your doctor or health care professional for regular checks on your progress. If you are taking this medicine over a prolonged period, carry an identification card with your name and address, the type and dose of your medicine, and your doctor's name and address. This medicine may increase your risk of getting an infection. Tell your doctor or health care professional if you are around anyone with measles or chickenpox, or if you develop sores or blisters that do not heal properly. If you are going to have surgery, tell your doctor or health care professional that you have taken this medicine within the last twelve months. Ask your doctor or health care professional about your diet. You may need to lower the amount of salt you eat. This medicine may affect blood sugar levels. If you have diabetes, check with your doctor or health care professional before you change your diet or the dose of your diabetic medicine. What side effects may I notice from receiving this medicine? Side effects that you should report to your doctor or health care professional as soon as possible: -allergic reactions like skin rash,  itching or hives, swelling of the face, lips, or tongue -changes in emotions or moods -changes in vision -depressed mood -eye pain -fever or chills, cough, sore throat, pain or difficulty passing urine -increased thirst -swelling of ankles, feet Side effects that usually do not require medical attention (report to your doctor or health care professional if they continue or are bothersome): -confusion, excitement, restlessness -headache -nausea, vomiting -skin problems, acne, thin and shiny skin -trouble sleeping -weight gain This list may not describe all possible side effects. Call your doctor for medical advice about side effects. You may report side effects to FDA at 1-800-FDA-1088. Where should I keep my medicine? Keep out of the reach of children. Store at room temperature between 15 and 30 degrees C (59 and 86 degrees F). Protect from light. Keep container tightly closed. Throw away any unused medicine after the expiration date. NOTE: This sheet is a summary. It may not cover all possible information. If you have questions about this medicine, talk to your doctor, pharmacist, or health care provider.  2015, Elsevier/Gold Standard. (2010-12-13 10:57:14)

## 2014-04-30 NOTE — ED Notes (Signed)
Pt states she work up with hands swollen and chest tightness.

## 2014-04-30 NOTE — ED Provider Notes (Signed)
CSN: [id number]     Arrival date & time 04/30/14  0400 History   First MD Initiated Contact with Patient 04/30/14 0408     Chief Complaint  Patient presents with  . Chest Pain     (Consider location/radiation/quality/duration/timing/severity/associated sxs/prior Treatment) Patient is a 33 y.o. female presenting with chest pain. The history is provided by the patient.  Chest Pain She woke up at about 3 AM and noted to take feeling in her chest and felt like her hands were swollen. There was no difficulty breathing or swallowing and she did not notice any rash. She notices that the ring on her right fourth finger feels very tight and is painful. She does have a history of having an allergic reaction to azithromycin but she has not been on any new medications recently. Last medication change was about 3 weeks ago when she was taken off of lisinopril and Cozaar and started on diltiazem. She had used some bleach before going to bed but cannot think of anything else that she had been exposed to. Since onset, symptoms have been stable. Nothing makes it better nothing makes it worse.  Past Medical History  Diagnosis Date  . Hypertension   . Blood dyscrasia     Leiden Factor V- diagnosed 2010   . GERD (gastroesophageal reflux disease)   . Chronic kidney disease     left ureteral stone   . Gallstones   . Factor V Leiden   . Pneumonia   . Unspecified symptom associated with female genital organs 07/14/2013  . Pelvic adhesions 07/23/2013   Past Surgical History  Procedure Laterality Date  . Appendectomy    . Dilation and curettage of uterus    . Other surgical history  2010     Essure sterilization procedure   . Cystoscopy/retrograde/ureteroscopy/stone extraction with basket  04/05/2011    Procedure: CYSTOSCOPY/RETROGRADE/URETEROSCOPY/STONE EXTRACTION WITH BASKET;  Surgeon: Kathi LudwigSigmund I Tannenbaum, MD;  Location: [facility];  Service: Urology;  Laterality: Left;   cysto, left retrograde, left  ureteroscopy, left ureteral stone basketry, placement of double j stent left ureter    . Cystoscopy w/ ureteral stent placement  04/05/2011    Procedure: CYSTOSCOPY WITH STENT REPLACEMENT;  Surgeon: Kathi LudwigSigmund I Tannenbaum, MD;  Location: [facility];  Service: Urology;  Laterality: Left;  . Tubal ligation    . Essure tubal ligation     Family History  Problem Relation Age of Onset  . Diabetes Father   . Hypertension Father   . Hypertension Mother   . Hypertension Maternal Grandmother   . Hypertension Maternal Grandfather   . Hypertension Paternal Grandmother   . Hypertension Paternal Grandfather   . Asthma Sister   . Asthma Brother   . Asthma Daughter   . Asthma Son    History  Substance Use Topics  . Smoking status: Never Smoker   . Smokeless tobacco: Never Used  . Alcohol Use: No   OB History    Gravida Para Term Preterm AB TAB SAB Ectopic Multiple Living   7 3   4  4   3      Review of Systems  Cardiovascular: Positive for chest pain.  All other systems reviewed and are negative.     Allergies  Augmentin; Azithromycin; Lisinopril; and Penicillins  Home Medications   Prior to Admission medications   Medication Sig Start Date End Date Taking? Authorizing Provider  diltiazem (CARDIZEM CD) 180 MG 24 hr capsule Take 1 capsule (180 mg total) by mouth daily.  04/06/14  Yes Collene Gobble, MD  doxycycline (DORYX) 100 MG DR capsule Take 100 mg by mouth 2 (two) times daily.   Yes Historical Provider, MD  EPINEPHrine (EPIPEN) 0.3 mg/0.3 mL SOAJ injection Inject 0.3 mLs (0.3 mg total) into the muscle once. 05/15/13  Yes Jessica C Copland, MD  lisinopril (PRINIVIL,ZESTRIL) 10 MG tablet Take 10 mg by mouth daily.    Historical Provider, MD  nitrofurantoin, macrocrystal-monohydrate, (MACROBID) 100 MG capsule Take 1 capsule (100 mg total) by mouth 2 (two) times daily. 03/30/14   Carmelina Dane, MD  omeprazole (PRILOSEC) 20 MG capsule Take 20 mg by mouth daily.     Historical Provider,  MD  ondansetron (ZOFRAN-ODT) 8 MG disintegrating tablet 1 tablet sublingual every 6-8 hours as needed for nausea 04/06/14   Collene Gobble, MD  phenazopyridine (PYRIDIUM) 200 MG tablet Take 200 mg by mouth 3 (three) times daily as needed for pain.    Historical Provider, MD   BP 166/110 mmHg  Pulse 89  Temp(Src) 98 F (36.7 C)  Resp 18  Ht 5\' 4"  (1.626 m)  Wt 156 lb (70.761 kg)  BMI 26.76 kg/m2  SpO2 100%  LMP 04/30/2014 Physical Exam  Nursing note and vitals reviewed.  33 year old female, resting comfortably and in no acute distress. Vital signs are significant for hypertension. Oxygen saturation is 100%, which is normal. Head is normocephalic and atraumatic. PERRLA, EOMI. Oropharynx is clear. Mild edema of the uvula is noted. There is mild facial edema. Neck is nontender and supple without adenopathy or JVD. Back is nontender and there is no CVA tenderness. Lungs are clear without rales, wheezes, or rhonchi. Chest is nontender. Heart has regular rate and rhythm without murmur. Abdomen is soft, flat, nontender without masses or hepatosplenomegaly and peristalsis is normoactive. Extremities have mild edema of the fingers, but no pitting edema of the legs. Full range of motion is present. Skin is warm and dry without rash. Neurologic: Mental status is normal, cranial nerves are intact, there are no motor or sensory deficits.  ED Course  Procedures (including critical care time) Labs Review Labs Reviewed - No data to display  Imaging Review No results found.   EKG Interpretation   Date/Time:  Saturday April 30 2014 04:13:42 EST Ventricular Rate:  85 PR Interval:  138 QRS Duration: 79 QT Interval:  371 QTC Calculation: 441 R Axis:   61 Text Interpretation:  Sinus rhythm Normal ECG When compared with ECG of  04/03/2011, No significant change was found Confirmed by [facility]  MD, Treanna Dumler  ([id number]) on 04/30/2014 4:17:36 AM      MDM   Final diagnoses:  Allergic reaction to  drug    Apparent allergic reaction. Most likely culprit is doxycycline even know she has been on this for several years. She is given a dose of diphenhydramine and prednisone and observed and there has been slight improvement with this. She is given another dose of diphenhydramine with famotidine. Old records reviewed confirming episode with allergy to azithromycin and recent office visit with change in antihypertensive medications.  She Significant relief with above noted treatment. She was given additional dose of diphenhydramine and a dose of famotidine with complete relief of symptoms. Swelling was significantly down. She is advised to discontinue doxycycline and is given a prescription for prednisone. She is advised using over-the-counter nonsedating antihistamine for the next 5 days and follow-up with PCP. She will need to.with her dermatologist regarding treatment for her acne.  Onalee Hua  Preston FleetingGlick, MD 04/30/14 [phone]

## 2015-05-03 ENCOUNTER — Ambulatory Visit (INDEPENDENT_AMBULATORY_CARE_PROVIDER_SITE_OTHER): Payer: BLUE CROSS/BLUE SHIELD | Admitting: Family Medicine

## 2015-05-03 VITALS — BP 130/80 | HR 91 | Temp 97.3°F | Resp 20 | Ht 65.0 in | Wt 191.6 lb

## 2015-05-03 DIAGNOSIS — J209 Acute bronchitis, unspecified: Secondary | ICD-10-CM

## 2015-05-03 MED ORDER — ALBUTEROL SULFATE 108 (90 BASE) MCG/ACT IN AEPB: 2.0000 | INHALATION_SPRAY | Freq: Four times a day (QID) | RESPIRATORY_TRACT | Status: AC | PRN

## 2015-05-03 MED ORDER — AZITHROMYCIN 250 MG PO TABS: ORAL_TABLET | ORAL | Status: DC

## 2015-05-03 MED ORDER — HYDROCOD POLST-CPM POLST ER 10-8 MG/5ML PO SUER: 5.0000 mL | Freq: Every evening | ORAL | Status: DC | PRN

## 2015-05-03 NOTE — Progress Notes (Signed)
 @  By signing my name below, I, Raven Small, attest that this documentation has been prepared under the direction and in the presence of Elvina Sidle, MD.  Electronically Signed: Andrew Au, ED Scribe. 05/03/2015. 7:28 PM.  Patient ID: [name] MRN: [id number], DOB: [birth year], 34 y.o. Date of Encounter: 05/03/2015, 7:27 PM  Primary Physician: JEFFERY,CHELLE, PA-C  Chief Complaint:  Chief Complaint  Patient presents with   Nasal Congestion    x 2 week   Sore Throat    HPI: 34 y.o. year old female with history below presents with a violent, productive cough for 1.5 weeks with associated nasal congestion and sore throat. Reports difficulty sleeping at night due to cough. States she coughs so hard that she almost vomits. She has tried robitussin and mucinex. She denies hx of asthma. She is not a smoker. No daily medication.   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.     Past Medical History  Diagnosis Date   Hypertension    Blood dyscrasia     Leiden Factor V- diagnosed 2010    GERD (gastroesophageal reflux disease)    Chronic kidney disease     left ureteral stone    Gallstones    Factor V Leiden (HCC)    Pneumonia    Unspecified symptom associated with female genital organs 07/14/2013   Pelvic adhesions 07/23/2013     Home Meds: Prior to Admission medications   Medication Sig Start Date End Date Taking? Authorizing Provider  diltiazem (CARDIZEM CD) 180 MG 24 hr capsule Take 1 capsule (180 mg total) by mouth daily. Patient not taking: Reported on 05/03/2015 04/06/14   Collene Gobble, MD  EPINEPHrine (EPIPEN) 0.3 mg/0.3 mL SOAJ injection Inject 0.3 mLs (0.3 mg total) into the muscle once. Patient not taking: Reported on 05/03/2015 05/15/13   Pearline Cables, MD  omeprazole (PRILOSEC) 20 MG capsule Take 20 mg by mouth daily. Reported on 05/03/2015    Historical Provider, MD  ondansetron (ZOFRAN-ODT) 8 MG disintegrating tablet 1 tablet  sublingual every 6-8 hours as needed for nausea Patient not taking: Reported on 05/03/2015 04/06/14   Collene Gobble, MD  predniSONE (DELTASONE) 50 MG tablet Take 1 tablet (50 mg total) by mouth daily. Patient not taking: Reported on 05/03/2015 04/30/14   Dione Booze, MD    Allergies:  Allergies  Allergen Reactions   Augmentin [Amoxicillin-Pot Clavulanate] Swelling   Azithromycin     Swelling and hives   Doxycycline     04/2014; suspected cause of swelling, SOB, CP.   Lisinopril     Patient developed dizziness and numbness in her arms   Penicillins Swelling and Rash    Social History   Social History   Marital Status: Married    Spouse Name: N/A   Number of Children: N/A   Years of Education: N/A   Occupational History   Not on file.   Social History Main Topics   Smoking status: Never Smoker    Smokeless tobacco: Never Used   Alcohol Use: No   Drug Use: No   Sexual Activity: Yes    Copy: None, Surgical   Other Topics Concern   Not on file   Social History Narrative   Review of Systems: Constitutional: negative for chills, fever, night sweats, weight changes, or fatigue  HEENT: negative for vision changes, hearing loss, rhinorrhea, ST, epistaxis, or sinus pressure Cardiovascular: negative for chest pain or palpitations Respiratory: negative for hemoptysis, wheezing, shortness  of breath, or cough Abdominal: negative for abdominal pain, nausea, vomiting, diarrhea, or constipation Dermatological: negative for rash Neurologic: negative for headache, dizziness, or syncope All other systems reviewed and are otherwise negative with the exception to those above and in the HPI.   Physical Exam: Blood pressure 130/80, pulse 91, temperature 97.3 F (36.3 C), temperature source Oral, resp. rate 20, height 5\' 5"  (1.651 m), weight 191 lb 9.6 oz (86.909 kg), SpO2 98 %., Body mass index is 31.88 kg/(m^2). General: Well developed, well  nourished, in no acute distress. Head: Normocephalic, atraumatic, eyes without discharge, sclera non-icteric, nares are without discharge. Bilateral auditory canals clear, TM's are without perforation, pearly grey and translucent with reflective cone of light bilaterally. Oral cavity moist, posterior pharynx with erythema.   Neck: Supple. No thyromegaly. Full ROM. No lymphadenopathy. Lungs: Bilateral wheeze. Breathing is unlabored. Heart: RRR with S1 S2. No murmurs, rubs, or gallops appreciated. Abdomen: Soft, non-tender, non-distended with normoactive bowel sounds. No hepatomegaly. No rebound/guarding. No obvious abdominal masses. Msk:  Strength and tone normal for age. Extremities/Skin: Warm and dry. No clubbing or cyanosis. No edema. No rashes or suspicious lesions. Neuro: Alert and oriented X 3. Moves all extremities spontaneously. Gait is normal. CNII-XII grossly in tact. Psych:  Responds to questions appropriately with a normal affect.    ASSESSMENT AND PLAN:  34 y.o. year old female with acute bronchitis This chart was scribed in my presence and reviewed by me personally.    ICD-9-CM ICD-10-CM   1. Acute bronchitis, unspecified organism 466.0 J20.9 chlorpheniramine-HYDROcodone (TUSSIONEX PENNKINETIC ER) 10-8 MG/5ML SUER     azithromycin (ZITHROMAX) 250 MG tablet     Albuterol Sulfate (PROAIR RESPICLICK) 108 (90 BASE) MCG/ACT AEPB      Signed, Elvina SidleKurt Lauenstein, MD 05/03/2015 7:27 PM

## 2015-05-03 NOTE — Patient Instructions (Signed)

## 2016-04-29 ENCOUNTER — Ambulatory Visit: Payer: BLUE CROSS/BLUE SHIELD

## 2016-12-21 ENCOUNTER — Emergency Department (HOSPITAL_COMMUNITY): Payer: No Typology Code available for payment source

## 2016-12-21 ENCOUNTER — Emergency Department (HOSPITAL_COMMUNITY)
Admission: EM | Admit: 2016-12-21 | Discharge: 2016-12-21 | Disposition: A | Discharge status: Home / Self Care | Payer: No Typology Code available for payment source | Attending: Emergency Medicine | Admitting: Emergency Medicine

## 2016-12-21 ENCOUNTER — Encounter (HOSPITAL_COMMUNITY): Payer: Self-pay | Admitting: Emergency Medicine

## 2016-12-21 DIAGNOSIS — N201 Calculus of ureter: Secondary | ICD-10-CM | POA: Insufficient documentation

## 2016-12-21 DIAGNOSIS — I129 Hypertensive chronic kidney disease with stage 1 through stage 4 chronic kidney disease, or unspecified chronic kidney disease: Secondary | ICD-10-CM | POA: Diagnosis not present

## 2016-12-21 DIAGNOSIS — N23 Unspecified renal colic: Secondary | ICD-10-CM

## 2016-12-21 DIAGNOSIS — N189 Chronic kidney disease, unspecified: Secondary | ICD-10-CM | POA: Insufficient documentation

## 2016-12-21 DIAGNOSIS — E876 Hypokalemia: Secondary | ICD-10-CM | POA: Diagnosis not present

## 2016-12-21 DIAGNOSIS — R1032 Left lower quadrant pain: Secondary | ICD-10-CM | POA: Diagnosis present

## 2016-12-21 LAB — CBC
HCT: 41.2 % (ref 36.0–46.0)
HEMOGLOBIN: 14.2 g/dL (ref 12.0–15.0)
MCH: 28.4 pg (ref 26.0–34.0)
MCHC: 34.5 g/dL (ref 30.0–36.0)
MCV: 82.4 fL (ref 78.0–100.0)
Platelets: 278 10*3/uL (ref 150–400)
RBC: 5 MIL/uL (ref 3.87–5.11)
RDW: 13.4 % (ref 11.5–15.5)
WBC: 13.5 10*3/uL — ABNORMAL HIGH (ref 4.0–10.5)

## 2016-12-21 LAB — COMPREHENSIVE METABOLIC PANEL
ALBUMIN: 4.2 g/dL (ref 3.5–5.0)
ALK PHOS: 91 U/L (ref 38–126)
ALT: 21 U/L (ref 14–54)
ANION GAP: 12 (ref 5–15)
AST: 19 U/L (ref 15–41)
BILIRUBIN TOTAL: 0.6 mg/dL (ref 0.3–1.2)
BUN: 9 mg/dL (ref 6–20)
CALCIUM: 9.1 mg/dL (ref 8.9–10.3)
CO2: 21 mmol/L — AB (ref 22–32)
Chloride: 106 mmol/L (ref 101–111)
Creatinine, Ser: 0.9 mg/dL (ref 0.44–1.00)
GFR calc Af Amer: >60 mL/min (ref >60)
GFR calc non Af Amer: >60 mL/min (ref >60)
GLUCOSE: 110 mg/dL — AB (ref 65–99)
Potassium: 3.1 mmol/L — ABNORMAL LOW (ref 3.5–5.1)
SODIUM: 139 mmol/L (ref 135–145)
TOTAL PROTEIN: 7.6 g/dL (ref 6.5–8.1)

## 2016-12-21 LAB — URINALYSIS, ROUTINE W REFLEX MICROSCOPIC
Bilirubin Urine: NEGATIVE
GLUCOSE, UA: NEGATIVE mg/dL
Ketones, ur: NEGATIVE mg/dL
Leukocytes, UA: NEGATIVE
Nitrite: NEGATIVE
PROTEIN: NEGATIVE mg/dL
SPECIFIC GRAVITY, URINE: 1.008 (ref 1.005–1.030)
pH: 7 (ref 5.0–8.0)

## 2016-12-21 LAB — LIPASE, BLOOD: Lipase: 49 U/L (ref 11–51)

## 2016-12-21 LAB — I-STAT BETA HCG BLOOD, ED (MC, WL, AP ONLY): I-stat hCG, quantitative: <5 m[IU]/mL (ref <5)

## 2016-12-21 MED ORDER — OXYCODONE-ACETAMINOPHEN 5-325 MG PO TABS: 1.0000 | ORAL_TABLET | Freq: Four times a day (QID) | ORAL | 0 refills | Status: DC | PRN

## 2016-12-21 MED ORDER — ONDANSETRON 4 MG PO TBDP
4.0000 mg | ORAL_TABLET | Freq: Once | ORAL | Status: AC | PRN
  Administered 2016-12-21: 4 mg via ORAL

## 2016-12-21 MED ORDER — POTASSIUM CHLORIDE CRYS ER 20 MEQ PO TBCR
40.0000 meq | EXTENDED_RELEASE_TABLET | Freq: Once | ORAL | Status: AC
  Administered 2016-12-21: 40 meq via ORAL
  Filled 2016-12-21: qty 2

## 2016-12-21 MED ORDER — ONDANSETRON 8 MG PO TBDP: ORAL_TABLET | ORAL | 0 refills | Status: DC

## 2016-12-21 MED ORDER — OXYCODONE-ACETAMINOPHEN 5-325 MG PO TABS
ORAL_TABLET | ORAL | Status: AC
  Filled 2016-12-21: qty 1

## 2016-12-21 MED ORDER — OXYCODONE-ACETAMINOPHEN 5-325 MG PO TABS
1.0000 | ORAL_TABLET | ORAL | Status: DC | PRN
  Administered 2016-12-21: 1 via ORAL

## 2016-12-21 MED ORDER — ONDANSETRON 4 MG PO TBDP
ORAL_TABLET | ORAL | Status: AC
  Filled 2016-12-21: qty 1

## 2016-12-21 NOTE — Discharge Instructions (Signed)
Take Advil for mild pain or the pain medicine prescribed for bad pain. You've also been prescribed medicine for nausea to take as needed. Call Dr. Imelda Pillowannenbaum's office in 2 days to schedule the next available appointment. Tell office staff that you were seen here. If your pain or nausea is not well controlled with the medication prescribed, [facility]. Your blood pressure should be rechecked by your primary care physician within the next 3 weeks. Today's was elevated at 152/101.

## 2016-12-21 NOTE — ED Provider Notes (Signed)
[facility]-EMERGENCY DEPT Provider Note   CSN: [id number] Arrival date & time: 12/21/16  1255     History   Chief Complaint Chief Complaint  Patient presents with  . Abdominal Pain  . Emesis    HPI [name] is a 36 y.o. female. Complains of left lower quadrant pain radiating to left flank feels like kidney stone she's had in the past started at 10:30 AM today pain is much improved since treatment with Percocet he received here and Tylenol. Associated symptoms include vomiting 2 times after onset of pain. She's not nauseated presently. Last normal menstrual period 2 days ago no vaginal discharge. No other associated symptoms HPI  Past Medical History:  Diagnosis Date  . Blood dyscrasia    Leiden Factor V- diagnosed 2010   . Chronic kidney disease    left ureteral stone   . Factor V Leiden (HCC)   . Gallstones   . GERD (gastroesophageal reflux disease)   . Hypertension   . Pelvic adhesions 07/23/2013  . Pneumonia   . Unspecified symptom associated with female genital organs 07/14/2013    Patient Active Problem List   Diagnosis Date Noted  . Pelvic adhesions 07/23/2013  . Unspecified symptom associated with female genital organs 07/14/2013  . Factor 5 Leiden mutation, heterozygous (HCC) 05/14/2013    Past Surgical History:  Procedure Laterality Date  . APPENDECTOMY    . CYSTOSCOPY W/ URETERAL STENT PLACEMENT  04/05/2011   Procedure: CYSTOSCOPY WITH STENT REPLACEMENT;  Surgeon: Kathi Ludwig, MD;  Location: [facility];  Service: Urology;  Laterality: Left;  . CYSTOSCOPY/RETROGRADE/URETEROSCOPY/STONE EXTRACTION WITH BASKET  04/05/2011   Procedure: CYSTOSCOPY/RETROGRADE/URETEROSCOPY/STONE EXTRACTION WITH BASKET;  Surgeon: Kathi Ludwig, MD;  Location: [facility];  Service: Urology;  Laterality: Left;   cysto, left retrograde, left ureteroscopy, left ureteral stone basketry, placement of double j stent left ureter    . DILATION AND CURETTAGE OF UTERUS    . ESSURE  TUBAL LIGATION    . OTHER SURGICAL HISTORY  2010    Essure sterilization procedure   . TUBAL LIGATION      OB History    Gravida Para Term Preterm AB Living   7 3     4 3    SAB TAB Ectopic Multiple Live Births   4       3       Home Medications    Prior to Admission medications   Medication Sig Start Date End Date Taking? Authorizing Provider  Albuterol Sulfate (PROAIR RESPICLICK) 108 (90 BASE) MCG/ACT AEPB Inhale 2 puffs into the lungs every 6 (six) hours as needed. 05/03/15   Elvina Sidle, MD  azithromycin (ZITHROMAX) 250 MG tablet Take 2 tabs PO x 1 dose, then 1 tab PO QD x 4 days 05/03/15   Elvina Sidle, MD  chlorpheniramine-HYDROcodone [facility] PENNKINETIC ER) 10-8 MG/5ML SUER Take 5 mLs by mouth at bedtime as needed for cough. 05/03/15   Elvina Sidle, MD  EPINEPHrine (EPIPEN) 0.3 mg/0.3 mL SOAJ injection Inject 0.3 mLs (0.3 mg total) into the muscle once. Patient not taking: Reported on 05/03/2015 05/15/13   Copland, Gwenlyn Found, MD    Family History Family History  Problem Relation Age of Onset  . Diabetes Father   . Hypertension Father   . Hypertension Mother   . Hypertension Maternal Grandmother   . Hypertension Maternal Grandfather   . Hypertension Paternal Grandmother   . Hypertension Paternal Grandfather   . Asthma Sister   . Asthma Brother   .  Asthma Daughter   . Asthma Son     Social History Social History  Substance Use Topics  . Smoking status: Never Smoker  . Smokeless tobacco: Never Used  . Alcohol use No     Allergies   Augmentin [amoxicillin-pot clavulanate]; Azithromycin; Doxycycline; Lisinopril; and Penicillins   Review of Systems Review of Systems  Constitutional: Negative.   HENT: Negative.   Respiratory: Negative.   Cardiovascular: Negative.   Gastrointestinal: Positive for abdominal pain and vomiting.  Genitourinary: Positive for flank pain.  Skin: Negative.   Neurological: Negative.   Psychiatric/Behavioral:  Negative.   All other systems reviewed and are negative.    Physical Exam Updated Vital Signs BP (!) 158/94 (BP Location: Right Arm)   Pulse 85   Temp 98 F (36.7 C)   Resp 16   LMP 11/20/2016   SpO2 100%   Physical Exam  Constitutional: She appears well-developed and well-nourished. No distress.  HENT:  Head: Normocephalic and atraumatic.  Eyes: Pupils are equal, round, and reactive to light. Conjunctivae are normal.  Neck: Neck supple. No tracheal deviation present. No thyromegaly present.  Cardiovascular: Normal rate and regular rhythm.   No murmur heard. Pulmonary/Chest: Effort normal and breath sounds normal.  Abdominal: Soft. Bowel sounds are normal. She exhibits no distension. There is no tenderness.  Obese.  Genitourinary:  Genitourinary Comments: Minimally tender at left flank  Musculoskeletal: Normal range of motion. She exhibits no edema or tenderness.  Neurological: She is alert. Coordination normal.  Skin: Skin is warm and dry. No rash noted.  Psychiatric: She has a normal mood and affect.  Nursing note and vitals reviewed.    ED Treatments / Results  Labs (all labs ordered are listed, but only abnormal results are displayed) Labs Reviewed  COMPREHENSIVE METABOLIC PANEL - Abnormal; Notable for the following:       Result Value   Potassium 3.1 (*)    CO2 21 (*)    Glucose, Bld 110 (*)    All other components within normal limits  CBC - Abnormal; Notable for the following:    WBC 13.5 (*)    All other components within normal limits  URINALYSIS, ROUTINE W REFLEX MICROSCOPIC - Abnormal; Notable for the following:    Color, Urine STRAW (*)    Hgb urine dipstick SMALL (*)    Bacteria, UA RARE (*)    Squamous Epithelial / LPF 0-5 (*)    All other components within normal limits  LIPASE, BLOOD  I-STAT BETA HCG BLOOD, ED ([facility], [facility], [facility] ONLY)   Results for orders placed or performed during the hospital encounter of 12/21/16  Lipase, blood  Result Value Ref  Range   Lipase 49 11 - 51 U/L  Comprehensive metabolic panel  Result Value Ref Range   Sodium 139 135 - 145 mmol/L   Potassium 3.1 (L) 3.5 - 5.1 mmol/L   Chloride 106 101 - 111 mmol/L   CO2 21 (L) 22 - 32 mmol/L   Glucose, Bld 110 (H) 65 - 99 mg/dL   BUN 9 6 - 20 mg/dL   Creatinine, Ser 1.610.90 0.44 - 1.00 mg/dL   Calcium 9.1 8.9 - 09.610.3 mg/dL   Total Protein 7.6 6.5 - 8.1 g/dL   Albumin 4.2 3.5 - 5.0 g/dL   AST 19 15 - 41 U/L   ALT 21 14 - 54 U/L   Alkaline Phosphatase 91 38 - 126 U/L   Total Bilirubin 0.6 0.3 - 1.2 mg/dL   GFR  calc non Af Amer >60 >60 mL/min   GFR calc Af Amer >60 >60 mL/min   Anion gap 12 5 - 15  CBC  Result Value Ref Range   WBC 13.5 (H) 4.0 - 10.5 K/uL   RBC 5.00 3.87 - 5.11 MIL/uL   Hemoglobin 14.2 12.0 - 15.0 g/dL   HCT 16.1 09.6 - 04.5 %   MCV 82.4 78.0 - 100.0 fL   MCH 28.4 26.0 - 34.0 pg   MCHC 34.5 30.0 - 36.0 g/dL   RDW 40.9 81.1 - 91.4 %   Platelets 278 150 - 400 K/uL  Urinalysis, Routine w reflex microscopic  Result Value Ref Range   Color, Urine STRAW (A) YELLOW   APPearance CLEAR CLEAR   Specific Gravity, Urine 1.008 1.005 - 1.030   pH 7.0 5.0 - 8.0   Glucose, UA NEGATIVE NEGATIVE mg/dL   Hgb urine dipstick SMALL (A) NEGATIVE   Bilirubin Urine NEGATIVE NEGATIVE   Ketones, ur NEGATIVE NEGATIVE mg/dL   Protein, ur NEGATIVE NEGATIVE mg/dL   Nitrite NEGATIVE NEGATIVE   Leukocytes, UA NEGATIVE NEGATIVE   RBC / HPF 0-5 0 - 5 RBC/hpf   WBC, UA 0-5 0 - 5 WBC/hpf   Bacteria, UA RARE (A) NONE SEEN   Squamous Epithelial / LPF 0-5 (A) NONE SEEN   Mucous PRESENT   I-Stat beta hCG blood, ED  Result Value Ref Range   I-stat hCG, quantitative <5.0 <5 mIU/mL   Comment 3           Ct Renal Stone Study  Result Date: 12/21/2016 CLINICAL DATA:  Flank pain EXAM: CT ABDOMEN AND PELVIS WITHOUT CONTRAST TECHNIQUE: Multidetector CT imaging of the abdomen and pelvis was performed following the standard protocol without IV contrast. COMPARISON:  09/28/2013  FINDINGS: Lower chest: No acute abnormality. Hepatobiliary: Liver is within normal limits. The gallbladder is well distended with dependent gallstones. No biliary ductal dilatation is seen. Pancreas: Unremarkable. No pancreatic ductal dilatation or surrounding inflammatory changes. Spleen: Normal in size without focal abnormality. Adrenals/Urinary Tract: The adrenal glands are within normal limits. Multiple bilateral renal calculi are seen. The largest of these lie in the upper pole of the right kidney measuring 4 mm. Distal left ureteral stone measuring 10-11 mm is noted with mild obstructive change. The bladder is well distended. Stomach/Bowel: The appendix is been surgically removed. No obstructive or inflammatory changes of bowel are seen. Vascular/Lymphatic: No significant vascular findings are present. No enlarged abdominal or pelvic lymph nodes. Reproductive: Uterus and bilateral adnexa are unremarkable. Essure coils are noted bilaterally. Other: No abdominal wall hernia or abnormality. No abdominopelvic ascites. Musculoskeletal: No acute or significant osseous findings. IMPRESSION: Nonobstructing bilateral renal stones are noted. 10-11 mm distal left ureteral stone with mild obstructive change. Chronic changes as described above. Electronically Signed   By: Alcide Clever M.D.   On: 12/21/2016 16:04   EKG  EKG Interpretation None       Radiology No results found.  Procedures Procedures (including critical care time)  Medications Ordered in ED Medications  ondansetron (ZOFRAN-ODT) 4 MG disintegrating tablet (not administered)  oxyCODONE-acetaminophen (PERCOCET/ROXICET) 5-325 MG per tablet 1 tablet (1 tablet Oral Given 12/21/16 1323)  oxyCODONE-acetaminophen (PERCOCET/ROXICET) 5-325 MG per tablet (not administered)  ondansetron (ZOFRAN-ODT) disintegrating tablet 4 mg (4 mg Oral Given 12/21/16 1309)     Initial Impression / Assessment and Plan / ED Course  I have reviewed the triage vital  signs and the nursing notes.  Pertinent labs & imaging  results that were available during my care of the patient were reviewed by me and considered in my medical decision making (see chart for details).     4:30 PM patient reports pain is minimal. She is no longer nauseated. She received oral potassium supplementation for hypokalemia. Plan referral Dr. Patsi Sears. Prescriptions Percocet, Zofran. Blood pressure recheck 3 weeks Final Clinical Impressions(s) / ED Diagnoses  Diagnosis #1 ureteral colic #2 hypokalemia #3 elevated blood pressure Final diagnoses:  None    New Prescriptions New Prescriptions   No medications on file     Doug Sou, MD 12/21/16 [phone]

## 2016-12-21 NOTE — ED Triage Notes (Signed)
Pt reports sever LLQ abdominal pain that woke her up out of sleep at 1030, LLQ painful to palpation. Pt states nausea vomiting X 3. Last BM last night, normal. LMP last month, normal.

## 2016-12-21 NOTE — ED Notes (Signed)
Patient transported to CT 

## 2016-12-23 ENCOUNTER — Emergency Department (HOSPITAL_COMMUNITY): Payer: No Typology Code available for payment source | Admitting: Registered Nurse

## 2016-12-23 ENCOUNTER — Observation Stay (HOSPITAL_COMMUNITY): Payer: No Typology Code available for payment source

## 2016-12-23 ENCOUNTER — Encounter (HOSPITAL_COMMUNITY): Payer: Self-pay | Admitting: Emergency Medicine

## 2016-12-23 ENCOUNTER — Encounter (HOSPITAL_COMMUNITY)
Admission: EM | Disposition: A | Discharge status: Home / Self Care | Payer: Self-pay | Source: Home / Self Care | Attending: Emergency Medicine

## 2016-12-23 ENCOUNTER — Emergency Department (HOSPITAL_COMMUNITY): Payer: No Typology Code available for payment source

## 2016-12-23 ENCOUNTER — Observation Stay (HOSPITAL_COMMUNITY)
Admission: EM | Admit: 2016-12-23 | Discharge: 2016-12-24 | Disposition: A | Discharge status: Home / Self Care | Payer: No Typology Code available for payment source | Attending: Urology | Admitting: Urology

## 2016-12-23 DIAGNOSIS — D6851 Activated protein C resistance: Secondary | ICD-10-CM | POA: Insufficient documentation

## 2016-12-23 DIAGNOSIS — N390 Urinary tract infection, site not specified: Secondary | ICD-10-CM | POA: Diagnosis not present

## 2016-12-23 DIAGNOSIS — N12 Tubulo-interstitial nephritis, not specified as acute or chronic: Secondary | ICD-10-CM

## 2016-12-23 DIAGNOSIS — N201 Calculus of ureter: Secondary | ICD-10-CM

## 2016-12-23 DIAGNOSIS — N200 Calculus of kidney: Secondary | ICD-10-CM

## 2016-12-23 DIAGNOSIS — Z880 Allergy status to penicillin: Secondary | ICD-10-CM | POA: Insufficient documentation

## 2016-12-23 DIAGNOSIS — I129 Hypertensive chronic kidney disease with stage 1 through stage 4 chronic kidney disease, or unspecified chronic kidney disease: Secondary | ICD-10-CM | POA: Diagnosis not present

## 2016-12-23 DIAGNOSIS — N189 Chronic kidney disease, unspecified: Secondary | ICD-10-CM | POA: Diagnosis not present

## 2016-12-23 DIAGNOSIS — Z9851 Tubal ligation status: Secondary | ICD-10-CM | POA: Insufficient documentation

## 2016-12-23 DIAGNOSIS — N202 Calculus of kidney with calculus of ureter: Secondary | ICD-10-CM | POA: Diagnosis not present

## 2016-12-23 DIAGNOSIS — Z79899 Other long term (current) drug therapy: Secondary | ICD-10-CM | POA: Diagnosis not present

## 2016-12-23 HISTORY — PX: CYSTOSCOPY W/ URETERAL STENT PLACEMENT: SHX1429

## 2016-12-23 LAB — BASIC METABOLIC PANEL
Anion gap: 8 (ref 5–15)
BUN: 11 mg/dL (ref 6–20)
CALCIUM: 8.6 mg/dL — AB (ref 8.9–10.3)
CO2: 24 mmol/L (ref 22–32)
Chloride: 104 mmol/L (ref 101–111)
Creatinine, Ser: 0.85 mg/dL (ref 0.44–1.00)
Glucose, Bld: 105 mg/dL — ABNORMAL HIGH (ref 65–99)
Potassium: 2.8 mmol/L — ABNORMAL LOW (ref 3.5–5.1)
SODIUM: 136 mmol/L (ref 135–145)

## 2016-12-23 LAB — URINALYSIS, ROUTINE W REFLEX MICROSCOPIC
BILIRUBIN URINE: NEGATIVE
GLUCOSE, UA: NEGATIVE mg/dL
KETONES UR: 5 mg/dL — AB
NITRITE: NEGATIVE
PROTEIN: 30 mg/dL — AB
SPECIFIC GRAVITY, URINE: 1.012 (ref 1.005–1.030)
pH: 7 (ref 5.0–8.0)

## 2016-12-23 LAB — CBC WITH DIFFERENTIAL/PLATELET
BASOS PCT: 0 %
Basophils Absolute: 0 10*3/uL (ref 0.0–0.1)
EOS ABS: 0 10*3/uL (ref 0.0–0.7)
Eosinophils Relative: 0 %
HCT: 31.8 % — ABNORMAL LOW (ref 36.0–46.0)
Hemoglobin: 10.9 g/dL — ABNORMAL LOW (ref 12.0–15.0)
Lymphocytes Relative: 11 %
Lymphs Abs: 1.2 10*3/uL (ref 0.7–4.0)
MCH: 28.4 pg (ref 26.0–34.0)
MCHC: 34.3 g/dL (ref 30.0–36.0)
MCV: 82.8 fL (ref 78.0–100.0)
MONO ABS: 1.5 10*3/uL — AB (ref 0.1–1.0)
MONOS PCT: 14 %
Neutro Abs: 7.9 10*3/uL — ABNORMAL HIGH (ref 1.7–7.7)
Neutrophils Relative %: 75 %
Platelets: 205 10*3/uL (ref 150–400)
RBC: 3.84 MIL/uL — ABNORMAL LOW (ref 3.87–5.11)
RDW: 13.3 % (ref 11.5–15.5)
WBC: 10.5 10*3/uL (ref 4.0–10.5)

## 2016-12-23 LAB — I-STAT CG4 LACTIC ACID, ED: Lactic Acid, Venous: 0.78 mmol/L (ref 0.5–1.9)

## 2016-12-23 SURGERY — CYSTOSCOPY, FLEXIBLE, WITH STENT REPLACEMENT
Anesthesia: General | Laterality: Left

## 2016-12-23 MED ORDER — ONDANSETRON 4 MG PO TBDP: 8.0000 mg | ORAL_TABLET | Freq: Four times a day (QID) | ORAL | Status: DC | PRN

## 2016-12-23 MED ORDER — DEXAMETHASONE SODIUM PHOSPHATE 10 MG/ML IJ SOLN
INTRAMUSCULAR | Status: AC
  Filled 2016-12-23: qty 1

## 2016-12-23 MED ORDER — OXYCODONE-ACETAMINOPHEN 5-325 MG PO TABS
1.0000 | ORAL_TABLET | ORAL | Status: DC | PRN
  Administered 2016-12-23: 1 via ORAL
  Filled 2016-12-23: qty 1

## 2016-12-23 MED ORDER — SUCCINYLCHOLINE CHLORIDE 20 MG/ML IJ SOLN
INTRAMUSCULAR | Status: DC | PRN
  Administered 2016-12-23: 140 mg via INTRAVENOUS

## 2016-12-23 MED ORDER — ACETAMINOPHEN 10 MG/ML IV SOLN
INTRAVENOUS | Status: AC
  Filled 2016-12-23: qty 100

## 2016-12-23 MED ORDER — LIDOCAINE HCL (CARDIAC) 20 MG/ML IV SOLN
INTRAVENOUS | Status: DC | PRN
  Administered 2016-12-23: 100 mg via INTRAVENOUS

## 2016-12-23 MED ORDER — OXYBUTYNIN CHLORIDE 5 MG PO TABS: 5.0000 mg | ORAL_TABLET | Freq: Three times a day (TID) | ORAL | Status: DC | PRN

## 2016-12-23 MED ORDER — OXYCODONE-ACETAMINOPHEN 5-325 MG PO TABS
1.0000 | ORAL_TABLET | Freq: Four times a day (QID) | ORAL | Status: DC | PRN
  Administered 2016-12-24: 2 via ORAL
  Filled 2016-12-23: qty 2

## 2016-12-23 MED ORDER — MORPHINE SULFATE (PF) 2 MG/ML IV SOLN
4.0000 mg | Freq: Once | INTRAVENOUS | Status: AC
  Administered 2016-12-23: 4 mg via INTRAVENOUS
  Filled 2016-12-23: qty 2

## 2016-12-23 MED ORDER — CIPROFLOXACIN IN D5W 400 MG/200ML IV SOLN
400.0000 mg | Freq: Two times a day (BID) | INTRAVENOUS | Status: DC
  Administered 2016-12-24: 400 mg via INTRAVENOUS
  Filled 2016-12-23: qty 200

## 2016-12-23 MED ORDER — ALBUTEROL SULFATE 108 (90 BASE) MCG/ACT IN AEPB: 2.0000 | INHALATION_SPRAY | Freq: Four times a day (QID) | RESPIRATORY_TRACT | Status: DC | PRN

## 2016-12-23 MED ORDER — FENTANYL CITRATE (PF) 100 MCG/2ML IJ SOLN
INTRAMUSCULAR | Status: DC | PRN
  Administered 2016-12-23: 150 ug via INTRAVENOUS

## 2016-12-23 MED ORDER — BISACODYL 10 MG RE SUPP: 10.0000 mg | Freq: Every day | RECTAL | Status: DC | PRN

## 2016-12-23 MED ORDER — FENTANYL CITRATE (PF) 250 MCG/5ML IJ SOLN
INTRAMUSCULAR | Status: AC
  Filled 2016-12-23: qty 5

## 2016-12-23 MED ORDER — PROPOFOL 10 MG/ML IV BOLUS
INTRAVENOUS | Status: DC | PRN
  Administered 2016-12-23: 110 mg via INTRAVENOUS

## 2016-12-23 MED ORDER — HYDROMORPHONE HCL 1 MG/ML IJ SOLN
0.2500 mg | INTRAMUSCULAR | Status: DC | PRN
Start: 2016-12-23 — End: 2016-12-24

## 2016-12-23 MED ORDER — PROPOFOL 10 MG/ML IV BOLUS
INTRAVENOUS | Status: AC
  Filled 2016-12-23: qty 20

## 2016-12-23 MED ORDER — LIDOCAINE 2% (20 MG/ML) 5 ML SYRINGE
INTRAMUSCULAR | Status: AC
  Filled 2016-12-23: qty 5

## 2016-12-23 MED ORDER — MIDAZOLAM HCL 2 MG/2ML IJ SOLN
INTRAMUSCULAR | Status: AC
  Filled 2016-12-23: qty 2

## 2016-12-23 MED ORDER — DEXAMETHASONE SODIUM PHOSPHATE 10 MG/ML IJ SOLN
INTRAMUSCULAR | Status: DC | PRN
  Administered 2016-12-23: 10 mg via INTRAVENOUS

## 2016-12-23 MED ORDER — DIPHENHYDRAMINE HCL 50 MG/ML IJ SOLN: 12.5000 mg | Freq: Four times a day (QID) | INTRAMUSCULAR | Status: DC | PRN

## 2016-12-23 MED ORDER — ONDANSETRON HCL 4 MG/2ML IJ SOLN
INTRAMUSCULAR | Status: AC
  Filled 2016-12-23: qty 2

## 2016-12-23 MED ORDER — MEPERIDINE HCL 25 MG/ML IJ SOLN
6.2500 mg | INTRAMUSCULAR | Status: DC | PRN
Start: 2016-12-23 — End: 2016-12-24

## 2016-12-23 MED ORDER — ONDANSETRON HCL 4 MG/2ML IJ SOLN
4.0000 mg | Freq: Once | INTRAMUSCULAR | Status: DC | PRN
  Filled 2016-12-23: qty 2

## 2016-12-23 MED ORDER — MIDAZOLAM HCL 5 MG/5ML IJ SOLN
INTRAMUSCULAR | Status: DC | PRN
  Administered 2016-12-23: 2 mg via INTRAVENOUS

## 2016-12-23 MED ORDER — ACETAMINOPHEN 10 MG/ML IV SOLN
INTRAVENOUS | Status: DC | PRN
  Administered 2016-12-23: 1000 mg via INTRAVENOUS

## 2016-12-23 MED ORDER — LACTATED RINGERS IV SOLN
INTRAVENOUS | Status: DC | PRN
  Administered 2016-12-23: 21:00:00 via INTRAVENOUS

## 2016-12-23 MED ORDER — HYDROMORPHONE HCL-NACL 0.5-0.9 MG/ML-% IV SOSY: 0.5000 mg | PREFILLED_SYRINGE | INTRAVENOUS | Status: DC | PRN

## 2016-12-23 MED ORDER — ACETAMINOPHEN 325 MG PO TABS: 650.0000 mg | ORAL_TABLET | ORAL | Status: DC | PRN

## 2016-12-23 MED ORDER — SENNOSIDES-DOCUSATE SODIUM 8.6-50 MG PO TABS: 1.0000 | ORAL_TABLET | Freq: Every evening | ORAL | Status: DC | PRN

## 2016-12-23 MED ORDER — CIPROFLOXACIN IN D5W 400 MG/200ML IV SOLN
400.0000 mg | Freq: Once | INTRAVENOUS | Status: AC
  Administered 2016-12-23: 400 mg via INTRAVENOUS
  Filled 2016-12-23: qty 200

## 2016-12-23 MED ORDER — DIPHENHYDRAMINE HCL 12.5 MG/5ML PO ELIX: 12.5000 mg | ORAL_SOLUTION | Freq: Four times a day (QID) | ORAL | Status: DC | PRN

## 2016-12-23 MED ORDER — ONDANSETRON HCL 4 MG/2ML IJ SOLN
INTRAMUSCULAR | Status: DC | PRN
  Administered 2016-12-23: 4 mg via INTRAVENOUS

## 2016-12-23 MED ORDER — KCL IN DEXTROSE-NACL 20-5-0.45 MEQ/L-%-% IV SOLN
INTRAVENOUS | Status: DC
  Administered 2016-12-23 – 2016-12-24 (×2): via INTRAVENOUS
  Filled 2016-12-23 (×3): qty 1000

## 2016-12-23 MED ORDER — IBUPROFEN 200 MG PO TABS: 400.0000 mg | ORAL_TABLET | ORAL | Status: DC | PRN

## 2016-12-23 MED ORDER — ONDANSETRON HCL 4 MG/2ML IJ SOLN
4.0000 mg | Freq: Once | INTRAMUSCULAR | Status: AC
  Administered 2016-12-23: 4 mg via INTRAVENOUS
  Filled 2016-12-23: qty 2

## 2016-12-23 MED ORDER — ONDANSETRON HCL 4 MG/2ML IJ SOLN
4.0000 mg | INTRAMUSCULAR | Status: DC | PRN
  Administered 2016-12-23: 4 mg via INTRAVENOUS

## 2016-12-23 MED ORDER — OXYCODONE HCL 5 MG PO TABS: 5.0000 mg | ORAL_TABLET | ORAL | Status: DC | PRN

## 2016-12-23 MED ORDER — SODIUM CHLORIDE 0.9 % IR SOLN
Status: DC | PRN
  Administered 2016-12-23: 3000 mL

## 2016-12-23 MED ORDER — SODIUM CHLORIDE 0.9 % IV SOLN
INTRAVENOUS | Status: DC
  Administered 2016-12-23: 20 mL/h via INTRAVENOUS

## 2016-12-23 MED ORDER — ENOXAPARIN SODIUM 40 MG/0.4ML ~~LOC~~ SOLN
40.0000 mg | Freq: Every day | SUBCUTANEOUS | Status: DC
  Administered 2016-12-23: 40 mg via SUBCUTANEOUS
  Filled 2016-12-23: qty 0.4

## 2016-12-23 SURGICAL SUPPLY — 10 items
BAG URO CATCHER STRL LF (MISCELLANEOUS) ×2 IMPLANT
CATH URET 5FR 28IN OPEN ENDED (CATHETERS) ×1 IMPLANT
CLOTH BEACON ORANGE TIMEOUT ST (SAFETY) ×2 IMPLANT
GLOVE SURG SS PI 8.0 STRL IVOR (GLOVE) ×1 IMPLANT
GOWN STRL REUS W/TWL XL LVL3 (GOWN DISPOSABLE) ×2 IMPLANT
GUIDEWIRE STR DUAL SENSOR (WIRE) ×2 IMPLANT
MANIFOLD NEPTUNE II (INSTRUMENTS) ×2 IMPLANT
PACK CYSTO (CUSTOM PROCEDURE TRAY) ×2 IMPLANT
STENT CONTOUR 6FRX24X.038 (STENTS) ×1 IMPLANT
TUBING CONNECTING 10 (TUBING) ×2 IMPLANT

## 2016-12-23 NOTE — H&P (Signed)
Subjective: CC: Left flank pain and fever.  Hx:  I was asked to see [name] in consultation by Dr. Freida BusmanAllen for a 10mm left distal stone with obstruction and fever.  She had the onset of severe pain on Saturday with N/V and a CT showed a 10mm left distal stone.  Her pain was controlled and she was sent home.  She returns today with a fever and recurrent pain and no change in the stone on repeat CT.  UA has TNTC WBC.  She has a history of stones with prior ESWL and cystoscopy with stenting.   She had a UTI last month and  Previously she had recurrent UTI's.   She has had no hematuria.  She reports her urine got cloudy today.  She has had no irritative voiding symptoms.  ROS:  Review of Systems  Constitutional: Positive for fever.  Gastrointestinal: Positive for abdominal pain and nausea.  Genitourinary: Positive for flank pain.  All other systems reviewed and are negative.   Allergies  Allergen Reactions  . Augmentin [Amoxicillin-Pot Clavulanate] Swelling  . Lisinopril     Patient developed dizziness and numbness in her arms  . Penicillins Swelling and Rash    Has patient had a PCN reaction causing immediate rash, facial/tongue/throat swelling, SOB or lightheadedness with hypotension: No Has patient had a PCN reaction causing severe rash involving mucus membranes or skin necrosis: No Has patient had a PCN reaction that required hospitalization: No Has patient had a PCN reaction occurring within the last 10 years: No If all of the above answers are "NO", then may proceed with Cephalosporin use.    Past Medical History:  Diagnosis Date  . Blood dyscrasia    Leiden Factor V- diagnosed 2010   . Chronic kidney disease    left ureteral stone   . Factor V Leiden (HCC)   . Gallstones   . GERD (gastroesophageal reflux disease)   . Hypertension   . Pelvic adhesions 07/23/2013  . Pneumonia   . Unspecified symptom associated with female genital organs 07/14/2013    Past Surgical History:   Procedure Laterality Date  . APPENDECTOMY    . CYSTOSCOPY W/ URETERAL STENT PLACEMENT  04/05/2011   Procedure: CYSTOSCOPY WITH STENT REPLACEMENT;  Surgeon: Kathi LudwigSigmund I Tannenbaum, MD;  Location: [facility];  Service: Urology;  Laterality: Left;  . CYSTOSCOPY/RETROGRADE/URETEROSCOPY/STONE EXTRACTION WITH BASKET  04/05/2011   Procedure: CYSTOSCOPY/RETROGRADE/URETEROSCOPY/STONE EXTRACTION WITH BASKET;  Surgeon: Kathi LudwigSigmund I Tannenbaum, MD;  Location: [facility];  Service: Urology;  Laterality: Left;   cysto, left retrograde, left ureteroscopy, left ureteral stone basketry, placement of double j stent left ureter    . DILATION AND CURETTAGE OF UTERUS    . ESSURE TUBAL LIGATION    . OTHER SURGICAL HISTORY  2010    Essure sterilization procedure   . TUBAL LIGATION      Social History   Social History  . Marital status: Divorced    Spouse name: N/A  . Number of children: N/A  . Years of education: N/A   Occupational History  . Not on file.   Social History Main Topics  . Smoking status: Never Smoker  . Smokeless tobacco: Never Used  . Alcohol use No  . Drug use: No  . Sexual activity: Yes    Birth control/ protection: None, Surgical   Other Topics Concern  . Not on file   Social History Narrative  . No narrative on file    Family History  Problem Relation Age  of Onset  . Diabetes Father   . Hypertension Father   . Hypertension Mother   . Hypertension Maternal Grandmother   . Hypertension Maternal Grandfather   . Hypertension Paternal Grandmother   . Hypertension Paternal Grandfather   . Asthma Sister   . Asthma Brother   . Asthma Daughter   . Asthma Son     Anti-infectives: Anti-infectives    Start     Dose/Rate Route Frequency Ordered Stop   12/23/16 1930  ciprofloxacin (CIPRO) IVPB 400 mg     400 mg 200 mL/hr over 60 Minutes Intravenous  Once 12/23/16 1917        Current Facility-Administered Medications  Medication Dose Route Frequency Provider Last Rate Last Dose   . 0.9 %  sodium chloride infusion   Intravenous Continuous Lorre NickAllen, Anthony, MD 20 mL/hr at 12/23/16 1826 20 mL/hr at 12/23/16 1826  . ciprofloxacin (CIPRO) IVPB 400 mg  400 mg Intravenous Once Lorre NickAllen, Anthony, MD 200 mL/hr at 12/23/16 1928 400 mg at 12/23/16 1928  . oxyCODONE-acetaminophen (PERCOCET/ROXICET) 5-325 MG per tablet 1 tablet  1 tablet Oral Q30 min PRN Lorre NickAllen, Anthony, MD   1 tablet at 12/23/16 1550   Current Outpatient Prescriptions  Medication Sig Dispense Refill  . Albuterol Sulfate (PROAIR RESPICLICK) 108 (90 BASE) MCG/ACT AEPB Inhale 2 puffs into the lungs every 6 (six) hours as needed. (Patient taking differently: Inhale 2 puffs into the lungs every 6 (six) hours as needed (allergies). ) 1 each 3  . EPINEPHrine (EPIPEN) 0.3 mg/0.3 mL SOAJ injection Inject 0.3 mLs (0.3 mg total) into the muscle once. (Patient taking differently: Inject 0.3 mg into the muscle once. For an allergic reaction) 1 Device 2  . ibuprofen (ADVIL,MOTRIN) 200 MG tablet Take 400-800 mg by mouth every 4 (four) hours as needed for mild pain.     Marland Kitchen. ondansetron (ZOFRAN ODT) 8 MG disintegrating tablet 8mg  ODT q8 hours prn nausea (Patient taking differently: Take 8 mg by mouth every 6 (six) hours as needed for nausea or vomiting. 8mg  ODT q8 hours prn nausea) 12 tablet 0  . oxyCODONE-acetaminophen (PERCOCET) 5-325 MG tablet Take 1-2 tablets by mouth every 6 (six) hours as needed for severe pain. 16 tablet 0  . azithromycin (ZITHROMAX) 250 MG tablet Take 2 tabs PO x 1 dose, then 1 tab PO QD x 4 days (Patient not taking: Reported on 12/23/2016) 6 tablet 0  . chlorpheniramine-HYDROcodone (TUSSIONEX PENNKINETIC ER) 10-8 MG/5ML SUER Take 5 mLs by mouth at bedtime as needed for cough. (Patient not taking: Reported on 12/23/2016) 100 mL 0     Objective: Vital signs in last 24 hours: Temp:  [100.9 F (38.3 C)] 100.9 F (38.3 C) (08/13 1545) Pulse Rate:  [74-114] 90 (08/13 1930) Resp:  [15-20] 20 (08/13 1930) BP:  (133-185)/(90-107) 133/90 (08/13 1930) SpO2:  [96 %-100 %] 99 % (08/13 1930)  Intake/Output from previous day: No intake/output data recorded. Intake/Output this shift: No intake/output data recorded.   Physical Exam  Constitutional: She is oriented to person, place, and time and well-developed, well-nourished, and in no distress.  HENT:  Head: Normocephalic and atraumatic.  Neck: Normal range of motion. Neck supple. No thyromegaly present.  Cardiovascular:  Sinus tach with normal heart sounds  Pulmonary/Chest: Effort normal and breath sounds normal. No respiratory distress.  Abdominal: Soft. There is tenderness (LLQ).  Musculoskeletal: Normal range of motion. She exhibits edema. She exhibits no tenderness.  Lymphadenopathy:    She has no cervical adenopathy.  Neurological:  She is alert and oriented to person, place, and time.  Skin: Skin is warm and dry.  Psychiatric: Mood and affect normal.  Vitals reviewed.   Lab Results:   Recent Labs  12/21/16 1306 12/23/16 1820  WBC 13.5* 10.5  HGB 14.2 10.9*  HCT 41.2 31.8*  PLT 278 205   BMET  Recent Labs  12/21/16 1306 12/23/16 1820  NA 139 136  K 3.1* 2.8*  CL 106 104  CO2 21* 24  GLUCOSE 110* 105*  BUN 9 11  CREATININE 0.90 0.85  CALCIUM 9.1 8.6*   PT/INR No results for input(s): LABPROT, INR in the last 72 hours. ABG No results for input(s): PHART, HCO3 in the last 72 hours.  Invalid input(s): PCO2, PO2  Studies/Results: Ct Renal Stone Study  Result Date: 12/23/2016 CLINICAL DATA:  Left flank pain and fever. History of renal calculi with recent study demonstrating distal ureteral calculi. EXAM: CT ABDOMEN AND PELVIS WITHOUT CONTRAST TECHNIQUE: Multidetector CT imaging of the abdomen and pelvis was performed following the standard protocol without IV contrast. COMPARISON:  12/21/2016 and 09/28/2013 FINDINGS: Lower chest: No acute abnormality. Hepatobiliary: Unenhanced appearance of the liver is unremarkable.  There are some calcified gallstones within a contracted gallbladder. No biliary ductal dilatation identified. Pancreas: Unremarkable. No pancreatic ductal dilatation or surrounding inflammatory changes. Spleen: Normal in size without focal abnormality. Adrenals/Urinary Tract: Multiple small bilateral nonobstructing renal calculi are again noted scattered throughout both renal collecting systems. No discernible hydronephrosis is present bilaterally. In the distal left ureter, there are 2 adjacent calculi present unchanged in appearance and position since the prior study. A larger 10 mm calculus is present and a smaller more superior 5 mm calculus. No bladder calculi identified. Stomach/Bowel: Bowel is unremarkable without evidence of obstruction or inflammation. No free air. Vascular/Lymphatic: No enlarged lymph nodes are seen. No vascular abnormalities. Reproductive: Uterus and bilateral adnexa are unremarkable. Status post bilateral tubal ligation. Other: No abdominal wall hernia or abnormality. No abdominopelvic ascites. Musculoskeletal: No acute or significant osseous findings. IMPRESSION: No change in positioning or appearance of 2 adjacent calculi within the distal left ureter with a larger 10 mm calculus and adjacent smaller superior 5 mm calculus. These are not causing any discernible left-sided hydronephrosis. Otherwise stable multiple small bilateral nonobstructing calculi scattered in both renal collecting systems. Electronically Signed   By: Irish Lack M.D.   On: 12/23/2016 18:53   I have discussed her case with Dr. Freida Busman and reviewed her films, labs and prior hospital notes.   Assessment: Left distal stone with UTI and fever.   She needs cystoscopy with stent insertion then will need delayed treatment of the stone with ureteroscopy.  The risks of bleeding, infection, injury to the ureter with need for a perc tube, need for secondary procedures, thrombotic events and anesthetic complications.       CC: Dr. Bruce Donath.      Mera Gunkel J 12/23/2016 [phone]

## 2016-12-23 NOTE — ED Notes (Signed)
Pain medication given in Triage. Patient advised about side effects of medications and  to avoid driving for a minimum of 4 hours.  

## 2016-12-23 NOTE — ED Notes (Signed)
[facility] RN at 21:20, [phone]

## 2016-12-23 NOTE — Op Note (Signed)
NAMJaquelyn Bitter:  Pall, Pegeen               ACCOUNT NO.:  [id number]  MEDICAL RECORD NO.:  [id number]  LOCATION:  [facility]15                         [facility]  PHYSICIAN:  Excell SeltzerJohn J. Annabell HowellsWrenn, M.D.    DATE OF BIRTH:  [birth year]  DATE OF PROCEDURE:  12/23/2016 DATE OF DISCHARGE:                              OPERATIVE REPORT   PROCEDURE:  Cystoscopy with insertion of left double-J stent.  PREOPERATIVE DIAGNOSIS:  Left distal ureteral stone with obstruction and fever.  POSTOPERATIVE DIAGNOSIS:  Left distal ureteral stone with obstruction and fever.  SURGEON:  Excell SeltzerJohn J. Annabell HowellsWrenn, M.D.  ANESTHESIA:  General.  SPECIMEN:  None.  DRAINS:  A 6-French x 24-cm left double-J stent.  BLOOD LOSS:  None.  COMPLICATIONS:  None.  INDICATIONS:  Ms. [name] is a 36 year old white female who was recently seen in the emergency room on November 8th, and was found on CT to have 10-mm left distal stone.  Her urine at that time was clear.  She was sent home after pain was controlled, but returned today with a fever and an infected-appearing urine.  It was felt that cystoscopy and stenting was indicated.  FINDINGS AND PROCEDURE:  She had received Cipro.  She was taken to the operating room where general anesthetic was induced.  She was placed in lithotomy position and was fitted with PAS hose.  Her perineum and genitalia were prepped with Betadine solution and she was draped in the usual sterile fashion.  Cystoscopy was performed using the 23-French scope and 30-degree lens. Examination revealed the normal urethra.  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  Ureteral orifices were unremarkable.  There were some purulent clumps in the bladder.  The left ureteral orifice was cannulated with a Sensor guidewire, which passed easily by the stone, which was visible on fluoroscopy.  The wire was passed to the kidney.  Some turbid urine effluxed alongside  the wire.  A 6-French 24-cm Contour double-J stent was then passed to the kidney under fluoroscopic guidance.  The wire was removed leaving a coil in the kidney and good coil in the bladder.  The bladder was then drained.  The cystoscope was removed.  The patient was taken down from lithotomy position.  Her anesthetic was reversed.  She was moved to the recovery room in stable condition.  There were no complications.     Excell SeltzerJohn J. Annabell HowellsWrenn, M.D.     JJW/MEDQ  D:  12/23/2016  T:  12/23/2016  Job:  [id number]

## 2016-12-23 NOTE — ED Notes (Signed)
ED Provider at bedside. 

## 2016-12-23 NOTE — ED Triage Notes (Signed)
Pt states she was dx with a kidney stone two days ago and today it has moved again and she has L sided flank pain with a fever. Alert and oriented.

## 2016-12-23 NOTE — Brief Op Note (Signed)
12/23/2016  9:11 PM  PATIENT:  [name]  36 y.o. female  PRE-OPERATIVE DIAGNOSIS:  left distal ureteral stone, obstruction, fever  POST-OPERATIVE DIAGNOSIS:  left distal ureteral stone, obstruction, fever  PROCEDURE:  Procedure(s): CYSTOSCOPY WITH STENT REPLACEMENT (Left)  SURGEON:  Surgeon(s) and Role:    Bjorn Pippin* Dunya Meiners, MD - Primary  PHYSICIAN ASSISTANT:   ASSISTANTS: none   ANESTHESIA:   general  EBL:  No intake/output data recorded.  BLOOD ADMINISTERED:none  DRAINS: 6 x 24 JJ stent   LOCAL MEDICATIONS USED:  NONE  SPECIMEN:  No Specimen  DISPOSITION OF SPECIMEN:  N/A  COUNTS:  YES  TOURNIQUET:  * No tourniquets in log *  DICTATION: .Other Dictation: Dictation Number [id number]  PLAN OF CARE: Discharge to home after PACU  PATIENT DISPOSITION:  PACU - hemodynamically stable.   Delay start of Pharmacological VTE agent (>24hrs) due to surgical blood loss or risk of bleeding: not applicable

## 2016-12-23 NOTE — Transfer of Care (Signed)
Immediate Anesthesia Transfer of Care Note  Patient: [name]  Procedure(s) Performed: Procedure(s): CYSTOSCOPY WITH STENT REPLACEMENT (Left)  Patient Location: PACU  Anesthesia Type:General  Level of Consciousness: awake, alert , oriented and patient cooperative  Airway & Oxygen Therapy: Patient Spontanous Breathing and Patient connected to face mask oxygen  Post-op Assessment: Report given to RN, Post -op Vital signs reviewed and stable and Patient moving all extremities X 4  Post vital signs: stable  Last Vitals:  Vitals:   12/23/16 1900 12/23/16 1930  BP:  133/90  Pulse: 88 90  Resp:  20  Temp:    SpO2: 100% 99%    Last Pain:  Vitals:   12/23/16 1927  TempSrc:   PainSc: 1          Complications: No apparent anesthesia complications

## 2016-12-23 NOTE — Anesthesia Preprocedure Evaluation (Signed)
Anesthesia Evaluation  Patient identified by MRN, date of birth, ID band Patient awake    Reviewed: Allergy & Precautions, NPO status , Patient's Chart, lab work & pertinent test results  Airway Mallampati: I  TM Distance: >3 FB Neck ROM: Full    Dental   Pulmonary    Pulmonary exam normal        Cardiovascular hypertension, Pt. on medications Normal cardiovascular exam     Neuro/Psych    GI/Hepatic GERD  Medicated and Controlled,  Endo/Other    Renal/GU      Musculoskeletal   Abdominal   Peds  Hematology   Anesthesia Other Findings   Reproductive/Obstetrics                             Anesthesia Physical Anesthesia Plan  ASA: II  Anesthesia Plan: General   Post-op Pain Management:    Induction: Intravenous, Rapid sequence and Cricoid pressure planned  PONV Risk Score and Plan: 3 and Ondansetron, Dexamethasone, Midazolam and Treatment may vary due to age or medical condition  Airway Management Planned: Oral ETT  Additional Equipment:   Intra-op Plan:   Post-operative Plan: Extubation in OR  Informed Consent: I have reviewed the patients History and Physical, chart, labs and discussed the procedure including the risks, benefits and alternatives for the proposed anesthesia with the patient or authorized representative who has indicated his/her understanding and acceptance.     Plan Discussed with: CRNA and Surgeon  Anesthesia Plan Comments:         Anesthesia Quick Evaluation

## 2016-12-23 NOTE — Anesthesia Postprocedure Evaluation (Signed)
Anesthesia Post Note  Patient: [name]  Procedure(s) Performed: Procedure(s) (LRB): CYSTOSCOPY WITH STENT REPLACEMENT (Left)     Patient location during evaluation: PACU Anesthesia Type: General Level of consciousness: awake and alert Pain management: pain level controlled Vital Signs Assessment: post-procedure vital signs reviewed and stable Respiratory status: spontaneous breathing, nonlabored ventilation, respiratory function stable and patient connected to nasal cannula oxygen Cardiovascular status: blood pressure returned to baseline and stable Postop Assessment: no signs of nausea or vomiting Anesthetic complications: no    Last Vitals:  Vitals:   12/23/16 2200 12/23/16 2221  BP: 116/77 (!) 157/93  Pulse: 98 83  Resp: 17 16  Temp: 37.2 C 37 C  SpO2: 100% 98%    Last Pain:  Vitals:   12/23/16 2221  TempSrc: Oral  PainSc: 0-No pain                 Alohilani Levenhagen DAVID

## 2016-12-23 NOTE — Discharge Instructions (Signed)

## 2016-12-23 NOTE — ED Notes (Signed)
Patient transported to CT 

## 2016-12-23 NOTE — ED Notes (Signed)
Per OR, They will obtain consent. SCD given. Patient transported via CRNA. Family at bedside.

## 2016-12-23 NOTE — Anesthesia Procedure Notes (Addendum)
Procedure Name: Intubation Date/Time: 12/23/2016 8:54 PM Performed by: Lissa Morales Pre-anesthesia Checklist: Patient identified, Emergency Drugs available, Suction available and Patient being monitored Patient Re-evaluated:Patient Re-evaluated prior to induction Oxygen Delivery Method: Circle system utilized Preoxygenation: Pre-oxygenation with 100% oxygen Induction Type: IV induction, Rapid sequence and Cricoid Pressure applied Ventilation: Mask ventilation without difficulty Laryngoscope Size: Mac and 4 Grade View: Grade II Tube type: Oral Tube size: 7.0 mm Number of attempts: 1 Airway Equipment and Method: Stylet and Oral airway Placement Confirmation: ETT inserted through vocal cords under direct vision,  positive ETCO2 and breath sounds checked- equal and bilateral Secured at: 21 cm Tube secured with: Tape Dental Injury: Teeth and Oropharynx as per pre-operative assessment

## 2016-12-23 NOTE — ED Provider Notes (Signed)
[facility]-EMERGENCY DEPT Provider Note   CSN: [id number] Arrival date & time: 12/23/16  1532     History   Chief Complaint Chief Complaint  Patient presents with  . Flank Pain    HPI [name] is a 36 y.o. female.  36 y/o female here w/ left  flank that radiates to her left lq x 3 days Seen recently and dx with left sided 10-65m kidney stone Now w/ fever and emesis No dysuria but urinary frequency noted Denies vag bleeding/discharge Using oxycodone w/o relief      Past Medical History:  Diagnosis Date  . Blood dyscrasia    Leiden Factor V- diagnosed 2010   . Chronic kidney disease    left ureteral stone   . Factor V Leiden (HCC)   . Gallstones   . GERD (gastroesophageal reflux disease)   . Hypertension   . Pelvic adhesions 07/23/2013  . Pneumonia   . Unspecified symptom associated with female genital organs 07/14/2013    Patient Active Problem List   Diagnosis Date Noted  . Pelvic adhesions 07/23/2013  . Unspecified symptom associated with female genital organs 07/14/2013  . Factor 5 Leiden mutation, heterozygous (HCC) 05/14/2013    Past Surgical History:  Procedure Laterality Date  . APPENDECTOMY    . CYSTOSCOPY W/ URETERAL STENT PLACEMENT  04/05/2011   Procedure: CYSTOSCOPY WITH STENT REPLACEMENT;  Surgeon: Kathi Ludwig, MD;  Location: [facility];  Service: Urology;  Laterality: Left;  . CYSTOSCOPY/RETROGRADE/URETEROSCOPY/STONE EXTRACTION WITH BASKET  04/05/2011   Procedure: CYSTOSCOPY/RETROGRADE/URETEROSCOPY/STONE EXTRACTION WITH BASKET;  Surgeon: Kathi Ludwig, MD;  Location: [facility];  Service: Urology;  Laterality: Left;   cysto, left retrograde, left ureteroscopy, left ureteral stone basketry, placement of double j stent left ureter    . DILATION AND CURETTAGE OF UTERUS    . ESSURE TUBAL LIGATION    . OTHER SURGICAL HISTORY  2010    Essure sterilization procedure   . TUBAL LIGATION      OB History    Gravida Para Term Preterm AB  Living   7 3     4 3    SAB TAB Ectopic Multiple Live Births   4       3       Home Medications    Prior to Admission medications   Medication Sig Start Date End Date Taking? Authorizing Provider  Albuterol Sulfate (PROAIR RESPICLICK) 108 (90 BASE) MCG/ACT AEPB Inhale 2 puffs into the lungs every 6 (six) hours as needed. Patient taking differently: Inhale 2 puffs into the lungs every 6 (six) hours as needed (allergies).  05/03/15  Yes Elvina Sidle, MD  EPINEPHrine (EPIPEN) 0.3 mg/0.3 mL SOAJ injection Inject 0.3 mLs (0.3 mg total) into the muscle once. Patient taking differently: Inject 0.3 mg into the muscle once. For an allergic reaction 05/15/13  Yes Copland, Gwenlyn Found, MD  ibuprofen (ADVIL,MOTRIN) 200 MG tablet Take 400-800 mg by mouth every 4 (four) hours as needed for mild pain.    Yes [provider]  ondansetron (ZOFRAN ODT) 8 MG disintegrating tablet 8mg  ODT q8 hours prn nausea Patient taking differently: Take 8 mg by mouth every 6 (six) hours as needed for nausea or vomiting. 8mg  ODT q8 hours prn nausea 12/21/16  Yes Doug Sou, MD  oxyCODONE-acetaminophen (PERCOCET) 5-325 MG tablet Take 1-2 tablets by mouth every 6 (six) hours as needed for severe pain. 12/21/16  Yes Doug Sou, MD  azithromycin (ZITHROMAX) 250 MG tablet Take 2 tabs PO x  1 dose, then 1 tab PO QD x 4 days Patient not taking: Reported on 12/23/2016 05/03/15   Elvina Sidle, MD  chlorpheniramine-HYDROcodone [facility] PENNKINETIC ER) 10-8 MG/5ML SUER Take 5 mLs by mouth at bedtime as needed for cough. Patient not taking: Reported on 12/23/2016 05/03/15   Elvina Sidle, MD    Family History Family History  Problem Relation Age of Onset  . Diabetes Father   . Hypertension Father   . Hypertension Mother   . Hypertension Maternal Grandmother   . Hypertension Maternal Grandfather   . Hypertension Paternal Grandmother   . Hypertension Paternal Grandfather   . Asthma Sister   . Asthma  Brother   . Asthma Daughter   . Asthma Son     Social History Social History  Substance Use Topics  . Smoking status: Never Smoker  . Smokeless tobacco: Never Used  . Alcohol use No     Allergies   Augmentin [amoxicillin-pot clavulanate]; Lisinopril; and Penicillins   Review of Systems Review of Systems  All other systems reviewed and are negative.    Physical Exam Updated Vital Signs BP (!) 185/107 (BP Location: Left Arm)   Pulse (!) 114   Temp (!) 100.9 F (38.3 C) (Oral)   Resp 16   LMP 12/19/2016   SpO2 100%   Physical Exam  Constitutional: She is oriented to person, place, and time. She appears well-developed and well-nourished.  Non-toxic appearance. No distress.  HENT:  Head: Normocephalic and atraumatic.  Eyes: Pupils are equal, round, and reactive to light. Conjunctivae, EOM and lids are normal.  Neck: Normal range of motion. Neck supple. No tracheal deviation present. No thyroid mass present.  Cardiovascular: Regular rhythm and normal heart sounds.  Tachycardia present.  Exam reveals no gallop.   No murmur heard. Pulmonary/Chest: Effort normal and breath sounds normal. No stridor. No respiratory distress. She has no decreased breath sounds. She has no wheezes. She has no rhonchi. She has no rales.  Abdominal: Soft. Normal appearance and bowel sounds are normal. She exhibits no distension. There is no tenderness. There is no rebound and no CVA tenderness.  Musculoskeletal: Normal range of motion. She exhibits no edema or tenderness.  Neurological: She is alert and oriented to person, place, and time. She has normal strength. No cranial nerve deficit or sensory deficit. GCS eye subscore is 4. GCS verbal subscore is 5. GCS motor subscore is 6.  Skin: Skin is warm and dry. No abrasion and no rash noted.  Psychiatric: She has a normal mood and affect. Her speech is normal and behavior is normal.  Nursing note and vitals reviewed.    ED Treatments / Results    Labs (all labs ordered are listed, but only abnormal results are displayed) Labs Reviewed  URINE CULTURE  CULTURE, BLOOD (ROUTINE X 2)  CULTURE, BLOOD (ROUTINE X 2)  CBC WITH DIFFERENTIAL/PLATELET  BASIC METABOLIC PANEL  URINALYSIS, ROUTINE W REFLEX MICROSCOPIC  I-STAT CG4 LACTIC ACID, ED    EKG  EKG Interpretation None       Radiology No results found.  Procedures Procedures (including critical care time)  Medications Ordered in ED Medications  oxyCODONE-acetaminophen (PERCOCET/ROXICET) 5-325 MG per tablet 1 tablet (1 tablet Oral Given 12/23/16 1550)  0.9 %  sodium chloride infusion (not administered)  ondansetron (ZOFRAN) injection 4 mg (not administered)  morphine 2 MG/ML injection 4 mg (not administered)     Initial Impression / Assessment and Plan / ED Course  I have reviewed the triage  vital signs and the nursing notes.  Pertinent labs & imaging results that were available during my care of the patient were reviewed by me and considered in my medical decision making (see chart for details).     Patient given pain meds here as well as started on IV antibiotics. Urinalysis shows infection and urine culture sent. CT consistent with large kidney stone. Discussed with urologist on call who will come and take patientto the or for stent  Final Clinical Impressions(s) / ED Diagnoses   Final diagnoses:  None    New Prescriptions New Prescriptions   No medications on file     Lorre NickAllen, Kawanna Christley, MD 12/23/16 1931

## 2016-12-24 ENCOUNTER — Encounter (HOSPITAL_COMMUNITY): Payer: Self-pay | Admitting: Urology

## 2016-12-24 ENCOUNTER — Other Ambulatory Visit: Payer: Self-pay | Admitting: Urology

## 2016-12-24 DIAGNOSIS — N390 Urinary tract infection, site not specified: Secondary | ICD-10-CM | POA: Diagnosis present

## 2016-12-24 LAB — CBC
HCT: 32.4 % — ABNORMAL LOW (ref 36.0–46.0)
Hemoglobin: 11.2 g/dL — ABNORMAL LOW (ref 12.0–15.0)
MCH: 28.2 pg (ref 26.0–34.0)
MCHC: 34.6 g/dL (ref 30.0–36.0)
MCV: 81.6 fL (ref 78.0–100.0)
Platelets: 203 10*3/uL (ref 150–400)
RBC: 3.97 MIL/uL (ref 3.87–5.11)
RDW: 13.1 % (ref 11.5–15.5)
WBC: 6.9 10*3/uL (ref 4.0–10.5)

## 2016-12-24 LAB — BASIC METABOLIC PANEL
Anion gap: 7 (ref 5–15)
BUN: 7 mg/dL (ref 6–20)
CO2: 23 mmol/L (ref 22–32)
CREATININE: 0.77 mg/dL (ref 0.44–1.00)
Calcium: 8.1 mg/dL — ABNORMAL LOW (ref 8.9–10.3)
Chloride: 108 mmol/L (ref 101–111)
Glucose, Bld: 232 mg/dL — ABNORMAL HIGH (ref 65–99)
Potassium: 3.5 mmol/L (ref 3.5–5.1)
SODIUM: 138 mmol/L (ref 135–145)

## 2016-12-24 LAB — CREATININE, SERUM
CREATININE: 0.78 mg/dL (ref 0.44–1.00)
GFR calc non Af Amer: >60 mL/min (ref >60)

## 2016-12-24 LAB — HIV ANTIBODY (ROUTINE TESTING W REFLEX): HIV SCREEN 4TH GENERATION: NONREACTIVE

## 2016-12-24 MED ORDER — PHENOL 1.4 % MT LIQD
1.0000 | OROMUCOSAL | Status: DC | PRN
  Administered 2016-12-24: 1 via OROMUCOSAL
  Filled 2016-12-24: qty 177

## 2016-12-24 MED ORDER — CIPROFLOXACIN HCL 500 MG PO TABS: 500.0000 mg | ORAL_TABLET | Freq: Two times a day (BID) | ORAL | 0 refills | Status: DC

## 2016-12-24 MED ORDER — OXYCODONE-ACETAMINOPHEN 5-325 MG PO TABS: 1.0000 | ORAL_TABLET | Freq: Four times a day (QID) | ORAL | 0 refills | Status: AC | PRN

## 2016-12-24 NOTE — Discharge Summary (Signed)
Physician Discharge Summary  Patient ID: [name] MRN: [id number] DOB/AGE: 37/14/1982 37 y.o.  Admit date: 12/23/2016 Discharge date: 12/24/2016  Admission Diagnoses:  Left ureteral stone  Discharge Diagnoses:  Principal Problem:   Left ureteral stone Active Problems:   UTI (urinary tract infection)   Past Medical History:  Diagnosis Date  . Blood dyscrasia    Leiden Factor V- diagnosed 2010   . Chronic kidney disease    left ureteral stone   . Factor V Leiden (HCC)   . Gallstones   . GERD (gastroesophageal reflux disease)   . Hypertension   . Pelvic adhesions 07/23/2013  . Pneumonia   . Unspecified symptom associated with female genital organs 07/14/2013    Surgeries: Procedure(s): CYSTOSCOPY WITH STENT REPLACEMENT on 12/23/2016   Consultants (if any):   Discharged Condition: Improved  Hospital Course: Tanya FeltsMelissa J Paullin is an 36 y.o. female who was admitted 12/23/2016 with a diagnosis of Left ureteral stone with a febrile UTI and went to the operating room on 12/23/2016 and underwent the above named procedures.  She was given Cipro and the fever has resolved.  Her pain has improved with the stent.  Cultures are pending.     She was given perioperative antibiotics:  Anti-infectives    Start     Dose/Rate Route Frequency Ordered Stop   12/24/16 0800  ciprofloxacin (CIPRO) IVPB 400 mg     400 mg 200 mL/hr over 60 Minutes Intravenous 2 times daily 12/23/16 2225     12/24/16 0000  ciprofloxacin (CIPRO) 500 MG tablet     500 mg Oral 2 times daily 12/24/16 0829     12/23/16 1930  ciprofloxacin (CIPRO) IVPB 400 mg     400 mg 200 mL/hr over 60 Minutes Intravenous  Once 12/23/16 1917 12/23/16 2028    .  She was given sequential compression devices and lovenox for DVT prophylaxis.  She benefited maximally from the hospital stay and there were no complications.    Recent vital signs:  Vitals:   12/24/16 0218 12/24/16 0625  BP: 116/80 125/72  Pulse: 72 79  Resp: 20 18   Temp: 97.7 F (36.5 C) 97.9 F (36.6 C)  SpO2: 98% 98%    Recent laboratory studies:  Lab Results  Component Value Date   HGB 11.2 (L) 12/24/2016   HGB 10.9 (L) 12/23/2016   HGB 14.2 12/21/2016   Lab Results  Component Value Date   WBC 6.9 12/24/2016   PLT 203 12/24/2016   Lab Results  Component Value Date   INR 1.2 (L) 08/03/2009   Lab Results  Component Value Date   NA 138 12/24/2016   K 3.5 12/24/2016   CL 108 12/24/2016   CO2 23 12/24/2016   BUN 7 12/24/2016   CREATININE 0.77 12/24/2016   GLUCOSE 232 (H) 12/24/2016    Discharge Medications:   Allergies as of 12/24/2016      Reactions   Augmentin [amoxicillin-pot Clavulanate] Swelling   Lisinopril    Patient developed dizziness and numbness in her arms   Penicillins Swelling, Rash   Has patient had a PCN reaction causing immediate rash, facial/tongue/throat swelling, SOB or lightheadedness with hypotension: No Has patient had a PCN reaction causing severe rash involving mucus membranes or skin necrosis: No Has patient had a PCN reaction that required hospitalization: No Has patient had a PCN reaction occurring within the last 10 years: No If all of the above answers are "NO", then may proceed with Cephalosporin use.  Medication List    TAKE these medications   Albuterol Sulfate 108 (90 Base) MCG/ACT Aepb Commonly known as:  PROAIR RESPICLICK Inhale 2 puffs into the lungs every 6 (six) hours as needed. What changed:  reasons to take this   ciprofloxacin 500 MG tablet Commonly known as:  CIPRO Take 1 tablet (500 mg total) by mouth 2 (two) times daily. Start day before f/u visit   EPINEPHrine 0.3 mg/0.3 mL Soaj injection Commonly known as:  EPIPEN Inject 0.3 mLs (0.3 mg total) into the muscle once. What changed:  additional instructions   ibuprofen 200 MG tablet Commonly known as:  ADVIL,MOTRIN Take 400-800 mg by mouth every 4 (four) hours as needed for mild pain.   ondansetron 8 MG  disintegrating tablet Commonly known as:  ZOFRAN ODT 8mg  ODT q8 hours prn nausea What changed:  how much to take  how to take this  when to take this  reasons to take this  additional instructions   oxyCODONE-acetaminophen 5-325 MG tablet Commonly known as:  PERCOCET Take 1-2 tablets by mouth every 6 (six) hours as needed for severe pain.       Diagnostic Studies: Dg C-arm 1-60 Min-no Report  Result Date: 12/23/2016 Fluoroscopy was utilized by the requesting physician.  No radiographic interpretation.   Ct Renal Stone Study  Result Date: 12/23/2016 CLINICAL DATA:  Left flank pain and fever. History of renal calculi with recent study demonstrating distal ureteral calculi. EXAM: CT ABDOMEN AND PELVIS WITHOUT CONTRAST TECHNIQUE: Multidetector CT imaging of the abdomen and pelvis was performed following the standard protocol without IV contrast. COMPARISON:  12/21/2016 and 09/28/2013 FINDINGS: Lower chest: No acute abnormality. Hepatobiliary: Unenhanced appearance of the liver is unremarkable. There are some calcified gallstones within a contracted gallbladder. No biliary ductal dilatation identified. Pancreas: Unremarkable. No pancreatic ductal dilatation or surrounding inflammatory changes. Spleen: Normal in size without focal abnormality. Adrenals/Urinary Tract: Multiple small bilateral nonobstructing renal calculi are again noted scattered throughout both renal collecting systems. No discernible hydronephrosis is present bilaterally. In the distal left ureter, there are 2 adjacent calculi present unchanged in appearance and position since the prior study. A larger 10 mm calculus is present and a smaller more superior 5 mm calculus. No bladder calculi identified. Stomach/Bowel: Bowel is unremarkable without evidence of obstruction or inflammation. No free air. Vascular/Lymphatic: No enlarged lymph nodes are seen. No vascular abnormalities. Reproductive: Uterus and bilateral adnexa are  unremarkable. Status post bilateral tubal ligation. Other: No abdominal wall hernia or abnormality. No abdominopelvic ascites. Musculoskeletal: No acute or significant osseous findings. IMPRESSION: No change in positioning or appearance of 2 adjacent calculi within the distal left ureter with a larger 10 mm calculus and adjacent smaller superior 5 mm calculus. These are not causing any discernible left-sided hydronephrosis. Otherwise stable multiple small bilateral nonobstructing calculi scattered in both renal collecting systems. Electronically Signed   By: Irish Lack M.D.   On: 12/23/2016 18:53   Ct Renal Stone Study  Result Date: 12/21/2016 CLINICAL DATA:  Flank pain EXAM: CT ABDOMEN AND PELVIS WITHOUT CONTRAST TECHNIQUE: Multidetector CT imaging of the abdomen and pelvis was performed following the standard protocol without IV contrast. COMPARISON:  09/28/2013 FINDINGS: Lower chest: No acute abnormality. Hepatobiliary: Liver is within normal limits. The gallbladder is well distended with dependent gallstones. No biliary ductal dilatation is seen. Pancreas: Unremarkable. No pancreatic ductal dilatation or surrounding inflammatory changes. Spleen: Normal in size without focal abnormality. Adrenals/Urinary Tract: The adrenal glands are within normal limits. Multiple  bilateral renal calculi are seen. The largest of these lie in the upper pole of the right kidney measuring 4 mm. Distal left ureteral stone measuring 10-11 mm is noted with mild obstructive change. The bladder is well distended. Stomach/Bowel: The appendix is been surgically removed. No obstructive or inflammatory changes of bowel are seen. Vascular/Lymphatic: No significant vascular findings are present. No enlarged abdominal or pelvic lymph nodes. Reproductive: Uterus and bilateral adnexa are unremarkable. Essure coils are noted bilaterally. Other: No abdominal wall hernia or abnormality. No abdominopelvic ascites. Musculoskeletal: No acute  or significant osseous findings. IMPRESSION: Nonobstructing bilateral renal stones are noted. 10-11 mm distal left ureteral stone with mild obstructive change. Chronic changes as described above. Electronically Signed   By: Alcide Clever M.D.   On: 12/21/2016 16:04    Disposition: 01-Home or Self Care  Discharge Instructions    Discontinue IV    Complete by:  As directed       Follow-up Information    Bjorn Pippin, MD Follow up.   Specialty:  Urology Why:  The office will call to set up the stone removal.  Contact information: [address] [place] [address] [phone]            Signed: Anner Crete 12/24/2016, 8:34 AM

## 2016-12-26 LAB — URINE CULTURE

## 2016-12-28 LAB — CULTURE, BLOOD (ROUTINE X 2)
CULTURE: NO GROWTH
Culture: NO GROWTH
Special Requests: ADEQUATE
Special Requests: ADEQUATE

## 2016-12-31 ENCOUNTER — Encounter (HOSPITAL_BASED_OUTPATIENT_CLINIC_OR_DEPARTMENT_OTHER): Payer: Self-pay | Admitting: *Deleted

## 2017-01-01 ENCOUNTER — Encounter (HOSPITAL_BASED_OUTPATIENT_CLINIC_OR_DEPARTMENT_OTHER): Payer: Self-pay | Admitting: *Deleted

## 2017-01-06 ENCOUNTER — Encounter (HOSPITAL_BASED_OUTPATIENT_CLINIC_OR_DEPARTMENT_OTHER): Payer: Self-pay | Admitting: *Deleted

## 2017-01-06 NOTE — Progress Notes (Signed)
NPO AFTER MN.  ARRIVE AT 0600.  CURRENT LAB RESULTS IN CHART AND EPIC. MAY TAKE OXYCODONE OR TYLENOL AM DOS W/ SIPS OF WATER.

## 2017-01-09 ENCOUNTER — Ambulatory Visit (HOSPITAL_BASED_OUTPATIENT_CLINIC_OR_DEPARTMENT_OTHER): Payer: No Typology Code available for payment source | Admitting: Anesthesiology

## 2017-01-09 ENCOUNTER — Ambulatory Visit (HOSPITAL_BASED_OUTPATIENT_CLINIC_OR_DEPARTMENT_OTHER)
Admission: RE | Admit: 2017-01-09 | Discharge: 2017-01-09 | Disposition: A | Discharge status: Home / Self Care | Payer: No Typology Code available for payment source | Source: Ambulatory Visit | Attending: Urology | Admitting: Urology

## 2017-01-09 ENCOUNTER — Encounter (HOSPITAL_BASED_OUTPATIENT_CLINIC_OR_DEPARTMENT_OTHER)
Admission: RE | Disposition: A | Discharge status: Home / Self Care | Payer: Self-pay | Source: Ambulatory Visit | Attending: Urology

## 2017-01-09 ENCOUNTER — Encounter (HOSPITAL_BASED_OUTPATIENT_CLINIC_OR_DEPARTMENT_OTHER): Payer: Self-pay

## 2017-01-09 DIAGNOSIS — I129 Hypertensive chronic kidney disease with stage 1 through stage 4 chronic kidney disease, or unspecified chronic kidney disease: Secondary | ICD-10-CM | POA: Insufficient documentation

## 2017-01-09 DIAGNOSIS — Z79891 Long term (current) use of opiate analgesic: Secondary | ICD-10-CM | POA: Insufficient documentation

## 2017-01-09 DIAGNOSIS — Z880 Allergy status to penicillin: Secondary | ICD-10-CM | POA: Insufficient documentation

## 2017-01-09 DIAGNOSIS — K219 Gastro-esophageal reflux disease without esophagitis: Secondary | ICD-10-CM | POA: Diagnosis not present

## 2017-01-09 DIAGNOSIS — D6851 Activated protein C resistance: Secondary | ICD-10-CM | POA: Insufficient documentation

## 2017-01-09 DIAGNOSIS — N189 Chronic kidney disease, unspecified: Secondary | ICD-10-CM | POA: Diagnosis not present

## 2017-01-09 DIAGNOSIS — Z888 Allergy status to other drugs, medicaments and biological substances status: Secondary | ICD-10-CM | POA: Diagnosis not present

## 2017-01-09 DIAGNOSIS — N201 Calculus of ureter: Secondary | ICD-10-CM | POA: Insufficient documentation

## 2017-01-09 DIAGNOSIS — Z79899 Other long term (current) drug therapy: Secondary | ICD-10-CM | POA: Diagnosis not present

## 2017-01-09 HISTORY — PX: CYSTOSCOPY/URETEROSCOPY/HOLMIUM LASER/STENT PLACEMENT: SHX6546

## 2017-01-09 HISTORY — DX: Hematuria, unspecified: R31.9

## 2017-01-09 HISTORY — DX: Calculus of ureter: N20.1

## 2017-01-09 HISTORY — DX: Unspecified asthma, uncomplicated: J45.909

## 2017-01-09 HISTORY — DX: Presence of spectacles and contact lenses: Z97.3

## 2017-01-09 HISTORY — DX: Calculus of kidney: N20.0

## 2017-01-09 HISTORY — DX: Personal history of urinary calculi: Z87.442

## 2017-01-09 SURGERY — CYSTOSCOPY/URETEROSCOPY/HOLMIUM LASER/STENT PLACEMENT
Anesthesia: General | Site: Renal | Laterality: Left

## 2017-01-09 MED ORDER — SODIUM CHLORIDE 0.9% FLUSH
3.0000 mL | Freq: Two times a day (BID) | INTRAVENOUS | Status: DC
  Filled 2017-01-09: qty 3

## 2017-01-09 MED ORDER — PROPOFOL 10 MG/ML IV BOLUS
INTRAVENOUS | Status: DC | PRN
  Administered 2017-01-09: 100 mg via INTRAVENOUS
  Administered 2017-01-09: 200 mg via INTRAVENOUS

## 2017-01-09 MED ORDER — PROPOFOL 10 MG/ML IV BOLUS
INTRAVENOUS | Status: AC
  Filled 2017-01-09: qty 40

## 2017-01-09 MED ORDER — PROPOFOL 500 MG/50ML IV EMUL
INTRAVENOUS | Status: AC
  Filled 2017-01-09: qty 100

## 2017-01-09 MED ORDER — DEXAMETHASONE SODIUM PHOSPHATE 4 MG/ML IJ SOLN
INTRAMUSCULAR | Status: DC | PRN
  Administered 2017-01-09: 10 mg via INTRAVENOUS

## 2017-01-09 MED ORDER — MIDAZOLAM HCL 5 MG/5ML IJ SOLN
INTRAMUSCULAR | Status: DC | PRN
  Administered 2017-01-09: 2 mg via INTRAVENOUS

## 2017-01-09 MED ORDER — LIDOCAINE 2% (20 MG/ML) 5 ML SYRINGE
INTRAMUSCULAR | Status: AC
  Filled 2017-01-09: qty 5

## 2017-01-09 MED ORDER — PROMETHAZINE HCL 25 MG/ML IJ SOLN
INTRAMUSCULAR | Status: AC
  Filled 2017-01-09: qty 1

## 2017-01-09 MED ORDER — ONDANSETRON HCL 4 MG/2ML IJ SOLN
INTRAMUSCULAR | Status: DC | PRN
  Administered 2017-01-09: 4 mg via INTRAVENOUS

## 2017-01-09 MED ORDER — MIDAZOLAM HCL 2 MG/2ML IJ SOLN
INTRAMUSCULAR | Status: AC
  Filled 2017-01-09: qty 2

## 2017-01-09 MED ORDER — HYDROMORPHONE HCL-NACL 0.5-0.9 MG/ML-% IV SOSY
PREFILLED_SYRINGE | INTRAVENOUS | Status: AC
  Filled 2017-01-09: qty 1

## 2017-01-09 MED ORDER — FENTANYL CITRATE (PF) 100 MCG/2ML IJ SOLN
INTRAMUSCULAR | Status: DC | PRN
Start: 2017-01-09 — End: 2017-01-09
  Administered 2017-01-09 (×4): 50 ug via INTRAVENOUS

## 2017-01-09 MED ORDER — LIDOCAINE 2% (20 MG/ML) 5 ML SYRINGE
INTRAMUSCULAR | Status: DC | PRN
  Administered 2017-01-09: 100 mg via INTRAVENOUS

## 2017-01-09 MED ORDER — LACTATED RINGERS IV SOLN
INTRAVENOUS | Status: DC
  Administered 2017-01-09: 07:00:00 via INTRAVENOUS
  Filled 2017-01-09: qty 1000

## 2017-01-09 MED ORDER — CIPROFLOXACIN HCL 500 MG PO TABS: 500.0000 mg | ORAL_TABLET | Freq: Two times a day (BID) | ORAL | 0 refills | Status: AC

## 2017-01-09 MED ORDER — CIPROFLOXACIN IN D5W 400 MG/200ML IV SOLN
INTRAVENOUS | Status: AC
Start: 2017-01-09 — End: 2017-01-09
  Filled 2017-01-09: qty 200

## 2017-01-09 MED ORDER — SODIUM CHLORIDE 0.9 % IV SOLN
250.0000 mL | INTRAVENOUS | Status: DC | PRN
  Filled 2017-01-09: qty 250

## 2017-01-09 MED ORDER — HYDROMORPHONE HCL 1 MG/ML IJ SOLN
0.2500 mg | INTRAMUSCULAR | Status: DC | PRN
  Administered 2017-01-09 (×2): 0.25 mg via INTRAVENOUS
  Filled 2017-01-09: qty 0.5

## 2017-01-09 MED ORDER — BELLADONNA ALKALOIDS-OPIUM 16.2-60 MG RE SUPP
RECTAL | Status: AC
  Filled 2017-01-09: qty 1

## 2017-01-09 MED ORDER — SODIUM CHLORIDE 0.9% FLUSH
3.0000 mL | INTRAVENOUS | Status: DC | PRN
  Filled 2017-01-09: qty 3

## 2017-01-09 MED ORDER — ONDANSETRON HCL 4 MG/2ML IJ SOLN
INTRAMUSCULAR | Status: AC
  Filled 2017-01-09: qty 2

## 2017-01-09 MED ORDER — ACETAMINOPHEN 650 MG RE SUPP
650.0000 mg | RECTAL | Status: DC | PRN
  Filled 2017-01-09: qty 1

## 2017-01-09 MED ORDER — CIPROFLOXACIN IN D5W 400 MG/200ML IV SOLN
400.0000 mg | INTRAVENOUS | Status: AC
  Administered 2017-01-09: 400 mg via INTRAVENOUS
  Filled 2017-01-09: qty 200

## 2017-01-09 MED ORDER — ARTIFICIAL TEARS OPHTHALMIC OINT
TOPICAL_OINTMENT | OPHTHALMIC | Status: AC
  Filled 2017-01-09: qty 3.5

## 2017-01-09 MED ORDER — SCOPOLAMINE 1 MG/3DAYS TD PT72
MEDICATED_PATCH | TRANSDERMAL | Status: DC | PRN
  Administered 2017-01-09: 1 via TRANSDERMAL

## 2017-01-09 MED ORDER — OXYCODONE HCL 5 MG PO TABS
5.0000 mg | ORAL_TABLET | ORAL | Status: DC | PRN
  Filled 2017-01-09: qty 2

## 2017-01-09 MED ORDER — FENTANYL CITRATE (PF) 100 MCG/2ML IJ SOLN
INTRAMUSCULAR | Status: AC
  Filled 2017-01-09: qty 2

## 2017-01-09 MED ORDER — PROMETHAZINE HCL 25 MG/ML IJ SOLN
6.2500 mg | INTRAMUSCULAR | Status: DC | PRN
  Administered 2017-01-09: 6.25 mg via INTRAVENOUS
  Filled 2017-01-09: qty 1

## 2017-01-09 MED ORDER — MORPHINE SULFATE (PF) 2 MG/ML IV SOLN
2.0000 mg | INTRAVENOUS | Status: DC | PRN
  Filled 2017-01-09: qty 1

## 2017-01-09 MED ORDER — SODIUM CHLORIDE 0.9 % IR SOLN
Status: DC | PRN
  Administered 2017-01-09: 4000 mL

## 2017-01-09 MED ORDER — DEXAMETHASONE SODIUM PHOSPHATE 10 MG/ML IJ SOLN
INTRAMUSCULAR | Status: AC
  Filled 2017-01-09: qty 1

## 2017-01-09 MED ORDER — SCOPOLAMINE 1 MG/3DAYS TD PT72
MEDICATED_PATCH | TRANSDERMAL | Status: AC
  Filled 2017-01-09: qty 1

## 2017-01-09 MED ORDER — LIDOCAINE HCL 2 % EX GEL
CUTANEOUS | Status: AC
  Filled 2017-01-09: qty 5

## 2017-01-09 MED ORDER — ACETAMINOPHEN 325 MG PO TABS
650.0000 mg | ORAL_TABLET | ORAL | Status: DC | PRN
  Filled 2017-01-09: qty 2

## 2017-01-09 SURGICAL SUPPLY — 28 items
BAG DRAIN URO-CYSTO SKYTR STRL (DRAIN) ×3 IMPLANT
BAG DRN UROCATH (DRAIN) ×1
BASKET STONE 1.7 NGAGE (UROLOGICAL SUPPLIES) ×2 IMPLANT
BASKET ZERO TIP NITINOL 2.4FR (BASKET) IMPLANT
BSKT STON RTRVL ZERO TP 2.4FR (BASKET)
CATH URET 5FR 28IN CONE TIP (BALLOONS)
CATH URET 5FR 28IN OPEN ENDED (CATHETERS) ×2 IMPLANT
CATH URET 5FR 70CM CONE TIP (BALLOONS) IMPLANT
CLOTH BEACON ORANGE TIMEOUT ST (SAFETY) ×3 IMPLANT
ELECT REM PT RETURN 9FT ADLT (ELECTROSURGICAL)
ELECTRODE REM PT RTRN 9FT ADLT (ELECTROSURGICAL) IMPLANT
FIBER LASER FLEXIVA 365 (UROLOGICAL SUPPLIES) ×2 IMPLANT
FIBER LASER TRAC TIP (UROLOGICAL SUPPLIES) IMPLANT
GLOVE BIO SURGEON STRL SZ7 (GLOVE) ×2 IMPLANT
GLOVE BIOGEL PI IND STRL 7.0 (GLOVE) IMPLANT
GLOVE BIOGEL PI INDICATOR 7.0 (GLOVE) ×2
GLOVE SURG SS PI 8.0 STRL IVOR (GLOVE) ×3 IMPLANT
GOWN STRL REUS W/TWL XL LVL3 (GOWN DISPOSABLE) ×5 IMPLANT
GUIDEWIRE 0.038 PTFE COATED (WIRE) IMPLANT
GUIDEWIRE ANG ZIPWIRE 038X150 (WIRE) IMPLANT
GUIDEWIRE STR DUAL SENSOR (WIRE) ×3 IMPLANT
INFUSOR MANOMETER BAG 3000ML (MISCELLANEOUS) ×3 IMPLANT
IV NS IRRIG 3000ML ARTHROMATIC (IV SOLUTION) ×3 IMPLANT
KIT RM TURNOVER CYSTO AR (KITS) ×3 IMPLANT
MANIFOLD NEPTUNE II (INSTRUMENTS) ×3 IMPLANT
PACK CYSTO (CUSTOM PROCEDURE TRAY) ×3 IMPLANT
TUBE CONNECTING 12'X1/4 (SUCTIONS)
TUBE CONNECTING 12X1/4 (SUCTIONS) IMPLANT

## 2017-01-09 NOTE — Brief Op Note (Signed)
01/09/2017  8:01 AM  PATIENT:  [name]  36 y.o. female  PRE-OPERATIVE DIAGNOSIS:  LEFT DISTAL STONE  POST-OPERATIVE DIAGNOSIS:  LEFT DISTAL STONE  PROCEDURE:  Procedure(s): LEFT URETEROSCOPY HOLMIUM LASER LEFT STENT REMOVAL (Left)  SURGEON:  Surgeon(s) and Role:    Bjorn Pippin* Daleyssa Loiselle, MD - Primary  PHYSICIAN ASSISTANT:   ASSISTANTS: none   ANESTHESIA:   general  EBL:  Total I/O In: 200 [I.V.:200] Out: -   BLOOD ADMINISTERED:none  DRAINS: none   LOCAL MEDICATIONS USED:  NONE  SPECIMEN:  Source of Specimen:  stone fragments  DISPOSITION OF SPECIMEN:  to patient to bring to the office.  COUNTS:  YES  TOURNIQUET:  * No tourniquets in log *  DICTATION: .Other Dictation: Dictation Number [id number]  PLAN OF CARE: Discharge to home after PACU  PATIENT DISPOSITION:  PACU - hemodynamically stable.   Delay start of Pharmacological VTE agent (>24hrs) due to surgical blood loss or risk of bleeding: not applicable

## 2017-01-09 NOTE — Anesthesia Postprocedure Evaluation (Signed)
Anesthesia Post Note  Patient: Tanya FeltsMelissa J Cossey  Procedure(s) Performed: Procedure(s) (LRB): LEFT URETEROSCOPY HOLMIUM LASER LEFT STENT REMOVAL (Left)     Anesthesia Post Evaluation  Last Vitals:  Vitals:   01/09/17 0930 01/09/17 1020  BP: (!) 142/98 140/89  Pulse: 92 87  Resp: 18 14  Temp:  37.2 C  SpO2: 93% 96%    Last Pain:  Vitals:   01/09/17 0917  TempSrc:   PainSc: 6                  Michell Giuliano,JAMES TERRILL

## 2017-01-09 NOTE — Interval H&P Note (Signed)
History and Physical Interval Note:  01/09/2017 7:22 AM  [name]  has presented today for surgery, with the diagnosis of LEFT DISTAL STONE  The various methods of treatment have been discussed with the patient and family. After consideration of risks, benefits and other options for treatment, the patient has consented to  Procedure(s): LEFT URETEROSCOPY HOLMIUM LASER LEFT STENT EXCHANGE (Left) as a surgical intervention .  The patient's history has been reviewed, patient examined, no change in status, stable for surgery.  I have reviewed the patient's chart and labs.  Questions were answered to the patient's satisfaction.     Rabab Currington J

## 2017-01-09 NOTE — Discharge Instructions (Addendum)
Ureteroscopy, Care After This sheet gives you information about how to care for yourself after your procedure. Your health care provider may also give you more specific instructions. If you have problems or questions, contact your health care provider. What can I expect after the procedure? After the procedure, it is common to have:  A burning sensation when you urinate.  Blood in your urine.  Mild discomfort in the bladder area or kidney area when urinating.  Needing to urinate more often or urgently.  Follow these instructions at home: Medicines  Take over-the-counter and prescription medicines only as told by your health care provider.  If you were prescribed an antibiotic medicine, take it as told by your health care provider. Do not stop taking the antibiotic even if you start to feel better. General instructions   Donot drive for 24 hours if you were given a medicine to help you relax (sedative) during your procedure.  To relieve burning, try taking a warm bath or holding a warm washcloth over your groin.  Drink enough fluid to keep your urine clear or pale yellow. ? Drink two 8-ounce glasses of water every hour for the first 2 hours after you get home. ? Continue to drink water often at home.  You can eat what you usually do.  Keep all follow-up visits as told by your health care provider. This is important. ? If you had a tube placed to keep urine flowing (ureteral stent), ask your health care provider when you need to return to have it removed. Contact a health care provider if:  You have chills or a fever.  You have burning pain for longer than 24 hours after the procedure.  You have blood in your urine for longer than 24 hours after the procedure. Get help right away if:  You have large amounts of blood in your urine.  You have blood clots in your urine.  You have very bad pain.  You have chest pain or trouble breathing.  You are unable to  urinate and you have the feeling of a full bladder. This information is not intended to replace advice given to you by your health care provider. Make sure you discuss any questions you have with your health care provider. Document Released: 05/04/2013 Document Revised: 02/13/2016 Document Reviewed: 02/09/2016 Elsevier Interactive Patient Education  2018 [employer]   Post Anesthesia Home Care Instructions  Activity: Get plenty of rest for the remainder of the day. A responsible individual must stay with you for 24 hours following the procedure.  For the next 24 hours, DO NOT: -Drive a car -Advertising copywriterperate machinery -Drink alcoholic beverages -Take any medication unless instructed by your physician -Make any legal decisions or sign important papers.  Meals: Start with liquid foods such as gelatin or soup. Progress to regular foods as tolerated. Avoid greasy, spicy, heavy foods. If nausea and/or vomiting occur, drink only clear liquids until the nausea and/or vomiting subsides. Call your physician if vomiting continues.  Special Instructions/Symptoms: Your throat may feel dry or sore from the anesthesia or the breathing tube placed in your throat during surgery. If this causes discomfort, gargle with warm salt water. The discomfort should disappear within 24 hours.  If you had a scopolamine patch placed behind your ear for the management of post- operative nausea and/or vomiting:  1. The medication in the patch is effective for 72 hours, after which it should be removed.  Wrap patch in a tissue and discard  in the trash. Wash hands thoroughly with soap and water. 2. You may remove the patch earlier than 72 hours if you experience unpleasant side effects which may include dry mouth, dizziness or visual disturbances. 3. Avoid touching the patch. Wash your hands with soap and water after contact with the patch.   CYSTOSCOPY HOME CARE INSTRUCTIONS  Activity: Rest for the remainder of the day.   Do not drive or operate equipment today.  You may resume normal activities in one to two days as instructed by your physician.   Meals: Drink plenty of liquids and eat light foods such as gelatin or soup this evening.  You may return to a normal meal plan tomorrow.  Return to Work: You may return to work in one to two days or as instructed by your physician.  Special Instructions / Symptoms: Call your physician if any of these symptoms occur:   -persistent or heavy bleeding  -bleeding which continues after first few urination  -large blood clots that are difficult to pass  -urine stream diminishes or stops completely  -fever equal to or higher than 101 degrees Farenheit.  -cloudy urine with a strong, foul odor  -severe pain  Females should always wipe from front to back after elimination.  You may feel some burning pain when you urinate.  This should disappear with time.  Applying moist heat to the lower abdomen or a hot tub bath may help relieve the pain. \    Patient Signature:  ________________________________________________________  Nurse's Signature:  ________________________________________________________

## 2017-01-09 NOTE — Anesthesia Procedure Notes (Signed)
Procedure Name: LMA Insertion Date/Time: 01/09/2017 7:39 AM Performed by: Sharee HolsterMASSAGEE, TERRY Pre-anesthesia Checklist: Patient identified, Emergency Drugs available, Suction available and Patient being monitored Patient Re-evaluated:Patient Re-evaluated prior to induction Oxygen Delivery Method: Circle system utilized Preoxygenation: Pre-oxygenation with 100% oxygen Induction Type: IV induction Ventilation: Mask ventilation without difficulty LMA: LMA inserted LMA Size: 4.0 Number of attempts: 1 Airway Equipment and Method: Bite block Placement Confirmation: positive ETCO2 Tube secured with: Tape Dental Injury: Teeth and Oropharynx as per pre-operative assessment

## 2017-01-09 NOTE — Anesthesia Postprocedure Evaluation (Signed)
Anesthesia Post Note  Patient: [name]  Procedure(s) Performed: Procedure(s) (LRB): LEFT URETEROSCOPY HOLMIUM LASER LEFT STENT REMOVAL (Left)     Patient location during evaluation: PACU Anesthesia Type: General Level of consciousness: awake and alert Pain management: pain level controlled Vital Signs Assessment: post-procedure vital signs reviewed and stable Respiratory status: spontaneous breathing, nonlabored ventilation, respiratory function stable and patient connected to nasal cannula oxygen Cardiovascular status: blood pressure returned to baseline and stable Postop Assessment: no signs of nausea or vomiting Anesthetic complications: no    Last Vitals:  Vitals:   01/09/17 0930 01/09/17 1020  BP: (!) 142/98 140/89  Pulse: 92 87  Resp: 18 14  Temp:  37.2 C  SpO2: 93% 96%    Last Pain:  Vitals:   01/09/17 0917  TempSrc:   PainSc: 6                  Izora Benn,JAMES TERRILL

## 2017-01-09 NOTE — H&P (View-Only) (Signed)
Subjective: CC: Left flank pain and fever.  Hx:  I was asked to see [name] in consultation by Dr. Freida BusmanAllen for a 10mm left distal stone with obstruction and fever.  She had the onset of severe pain on Saturday with N/V and a CT showed a 10mm left distal stone.  Her pain was controlled and she was sent home.  She returns today with a fever and recurrent pain and no change in the stone on repeat CT.  UA has TNTC WBC.  She has a history of stones with prior ESWL and cystoscopy with stenting.   She had a UTI last month and  Previously she had recurrent UTI's.   She has had no hematuria.  She reports her urine got cloudy today.  She has had no irritative voiding symptoms.  ROS:  Review of Systems  Constitutional: Positive for fever.  Gastrointestinal: Positive for abdominal pain and nausea.  Genitourinary: Positive for flank pain.  All other systems reviewed and are negative.   Allergies  Allergen Reactions  . Augmentin [Amoxicillin-Pot Clavulanate] Swelling  . Lisinopril     Patient developed dizziness and numbness in her arms  . Penicillins Swelling and Rash    Has patient had a PCN reaction causing immediate rash, facial/tongue/throat swelling, SOB or lightheadedness with hypotension: No Has patient had a PCN reaction causing severe rash involving mucus membranes or skin necrosis: No Has patient had a PCN reaction that required hospitalization: No Has patient had a PCN reaction occurring within the last 10 years: No If all of the above answers are "NO", then may proceed with Cephalosporin use.    Past Medical History:  Diagnosis Date  . Blood dyscrasia    Leiden Factor V- diagnosed 2010   . Chronic kidney disease    left ureteral stone   . Factor V Leiden (HCC)   . Gallstones   . GERD (gastroesophageal reflux disease)   . Hypertension   . Pelvic adhesions 07/23/2013  . Pneumonia   . Unspecified symptom associated with female genital organs 07/14/2013    Past Surgical History:   Procedure Laterality Date  . APPENDECTOMY    . CYSTOSCOPY W/ URETERAL STENT PLACEMENT  04/05/2011   Procedure: CYSTOSCOPY WITH STENT REPLACEMENT;  Surgeon: Kathi LudwigSigmund I Tannenbaum, MD;  Location: [facility];  Service: Urology;  Laterality: Left;  . CYSTOSCOPY/RETROGRADE/URETEROSCOPY/STONE EXTRACTION WITH BASKET  04/05/2011   Procedure: CYSTOSCOPY/RETROGRADE/URETEROSCOPY/STONE EXTRACTION WITH BASKET;  Surgeon: Kathi LudwigSigmund I Tannenbaum, MD;  Location: [facility];  Service: Urology;  Laterality: Left;   cysto, left retrograde, left ureteroscopy, left ureteral stone basketry, placement of double j stent left ureter    . DILATION AND CURETTAGE OF UTERUS    . ESSURE TUBAL LIGATION    . OTHER SURGICAL HISTORY  2010    Essure sterilization procedure   . TUBAL LIGATION      Social History   Social History  . Marital status: Divorced    Spouse name: N/A  . Number of children: N/A  . Years of education: N/A   Occupational History  . Not on file.   Social History Main Topics  . Smoking status: Never Smoker  . Smokeless tobacco: Never Used  . Alcohol use No  . Drug use: No  . Sexual activity: Yes    Birth control/ protection: None, Surgical   Other Topics Concern  . Not on file   Social History Narrative  . No narrative on file    Family History  Problem Relation Age  of Onset  . Diabetes Father   . Hypertension Father   . Hypertension Mother   . Hypertension Maternal Grandmother   . Hypertension Maternal Grandfather   . Hypertension Paternal Grandmother   . Hypertension Paternal Grandfather   . Asthma Sister   . Asthma Brother   . Asthma Daughter   . Asthma Son     Anti-infectives: Anti-infectives    Start     Dose/Rate Route Frequency Ordered Stop   12/23/16 1930  ciprofloxacin (CIPRO) IVPB 400 mg     400 mg 200 mL/hr over 60 Minutes Intravenous  Once 12/23/16 1917        Current Facility-Administered Medications  Medication Dose Route Frequency Provider Last Rate Last Dose   . 0.9 %  sodium chloride infusion   Intravenous Continuous Lorre NickAllen, Anthony, MD 20 mL/hr at 12/23/16 1826 20 mL/hr at 12/23/16 1826  . ciprofloxacin (CIPRO) IVPB 400 mg  400 mg Intravenous Once Lorre NickAllen, Anthony, MD 200 mL/hr at 12/23/16 1928 400 mg at 12/23/16 1928  . oxyCODONE-acetaminophen (PERCOCET/ROXICET) 5-325 MG per tablet 1 tablet  1 tablet Oral Q30 min PRN Lorre NickAllen, Anthony, MD   1 tablet at 12/23/16 1550   Current Outpatient Prescriptions  Medication Sig Dispense Refill  . Albuterol Sulfate (PROAIR RESPICLICK) 108 (90 BASE) MCG/ACT AEPB Inhale 2 puffs into the lungs every 6 (six) hours as needed. (Patient taking differently: Inhale 2 puffs into the lungs every 6 (six) hours as needed (allergies). ) 1 each 3  . EPINEPHrine (EPIPEN) 0.3 mg/0.3 mL SOAJ injection Inject 0.3 mLs (0.3 mg total) into the muscle once. (Patient taking differently: Inject 0.3 mg into the muscle once. For an allergic reaction) 1 Device 2  . ibuprofen (ADVIL,MOTRIN) 200 MG tablet Take 400-800 mg by mouth every 4 (four) hours as needed for mild pain.     Marland Kitchen. ondansetron (ZOFRAN ODT) 8 MG disintegrating tablet 8mg  ODT q8 hours prn nausea (Patient taking differently: Take 8 mg by mouth every 6 (six) hours as needed for nausea or vomiting. 8mg  ODT q8 hours prn nausea) 12 tablet 0  . oxyCODONE-acetaminophen (PERCOCET) 5-325 MG tablet Take 1-2 tablets by mouth every 6 (six) hours as needed for severe pain. 16 tablet 0  . azithromycin (ZITHROMAX) 250 MG tablet Take 2 tabs PO x 1 dose, then 1 tab PO QD x 4 days (Patient not taking: Reported on 12/23/2016) 6 tablet 0  . chlorpheniramine-HYDROcodone (TUSSIONEX PENNKINETIC ER) 10-8 MG/5ML SUER Take 5 mLs by mouth at bedtime as needed for cough. (Patient not taking: Reported on 12/23/2016) 100 mL 0     Objective: Vital signs in last 24 hours: Temp:  [100.9 F (38.3 C)] 100.9 F (38.3 C) (08/13 1545) Pulse Rate:  [74-114] 90 (08/13 1930) Resp:  [15-20] 20 (08/13 1930) BP:  (133-185)/(90-107) 133/90 (08/13 1930) SpO2:  [96 %-100 %] 99 % (08/13 1930)  Intake/Output from previous day: No intake/output data recorded. Intake/Output this shift: No intake/output data recorded.   Physical Exam  Constitutional: She is oriented to person, place, and time and well-developed, well-nourished, and in no distress.  HENT:  Head: Normocephalic and atraumatic.  Neck: Normal range of motion. Neck supple. No thyromegaly present.  Cardiovascular:  Sinus tach with normal heart sounds  Pulmonary/Chest: Effort normal and breath sounds normal. No respiratory distress.  Abdominal: Soft. There is tenderness (LLQ).  Musculoskeletal: Normal range of motion. She exhibits edema. She exhibits no tenderness.  Lymphadenopathy:    She has no cervical adenopathy.  Neurological:  She is alert and oriented to person, place, and time.  Skin: Skin is warm and dry.  Psychiatric: Mood and affect normal.  Vitals reviewed.   Lab Results:   Recent Labs  12/21/16 1306 12/23/16 1820  WBC 13.5* 10.5  HGB 14.2 10.9*  HCT 41.2 31.8*  PLT 278 205   BMET  Recent Labs  12/21/16 1306 12/23/16 1820  NA 139 136  K 3.1* 2.8*  CL 106 104  CO2 21* 24  GLUCOSE 110* 105*  BUN 9 11  CREATININE 0.90 0.85  CALCIUM 9.1 8.6*   PT/INR No results for input(s): LABPROT, INR in the last 72 hours. ABG No results for input(s): PHART, HCO3 in the last 72 hours.  Invalid input(s): PCO2, PO2  Studies/Results: Ct Renal Stone Study  Result Date: 12/23/2016 CLINICAL DATA:  Left flank pain and fever. History of renal calculi with recent study demonstrating distal ureteral calculi. EXAM: CT ABDOMEN AND PELVIS WITHOUT CONTRAST TECHNIQUE: Multidetector CT imaging of the abdomen and pelvis was performed following the standard protocol without IV contrast. COMPARISON:  12/21/2016 and 09/28/2013 FINDINGS: Lower chest: No acute abnormality. Hepatobiliary: Unenhanced appearance of the liver is unremarkable.  There are some calcified gallstones within a contracted gallbladder. No biliary ductal dilatation identified. Pancreas: Unremarkable. No pancreatic ductal dilatation or surrounding inflammatory changes. Spleen: Normal in size without focal abnormality. Adrenals/Urinary Tract: Multiple small bilateral nonobstructing renal calculi are again noted scattered throughout both renal collecting systems. No discernible hydronephrosis is present bilaterally. In the distal left ureter, there are 2 adjacent calculi present unchanged in appearance and position since the prior study. A larger 10 mm calculus is present and a smaller more superior 5 mm calculus. No bladder calculi identified. Stomach/Bowel: Bowel is unremarkable without evidence of obstruction or inflammation. No free air. Vascular/Lymphatic: No enlarged lymph nodes are seen. No vascular abnormalities. Reproductive: Uterus and bilateral adnexa are unremarkable. Status post bilateral tubal ligation. Other: No abdominal wall hernia or abnormality. No abdominopelvic ascites. Musculoskeletal: No acute or significant osseous findings. IMPRESSION: No change in positioning or appearance of 2 adjacent calculi within the distal left ureter with a larger 10 mm calculus and adjacent smaller superior 5 mm calculus. These are not causing any discernible left-sided hydronephrosis. Otherwise stable multiple small bilateral nonobstructing calculi scattered in both renal collecting systems. Electronically Signed   By: Irish Lack M.D.   On: 12/23/2016 18:53   I have discussed her case with Dr. Freida Busman and reviewed her films, labs and prior hospital notes.   Assessment: Left distal stone with UTI and fever.   She needs cystoscopy with stent insertion then will need delayed treatment of the stone with ureteroscopy.  The risks of bleeding, infection, injury to the ureter with need for a perc tube, need for secondary procedures, thrombotic events and anesthetic complications.       CC: Dr. Bruce Donath.      Ryli Standlee J 12/23/2016 [phone]

## 2017-01-09 NOTE — Transfer of Care (Signed)
  Last Vitals:  Vitals:   01/09/17 0627  BP: (!) 167/94  Pulse: 93  Resp: 16  Temp: 37 C  SpO2: 100%    Last Pain:  Vitals:   01/09/17 0639  TempSrc:   PainSc: 7       Patients Stated Pain Goal: 5 (01/09/17 [id number])  Immediate Anesthesia Transfer of Care Note  Patient: Tanya FeltsMelissa J [name]  Procedure(s) Performed: Procedure(s) (LRB): LEFT URETEROSCOPY HOLMIUM LASER LEFT STENT REMOVAL (Left)  Patient Location: PACU  Anesthesia Type: General  Level of Consciousness: awake, alert  and oriented  Airway & Oxygen Therapy: Patient Spontanous Breathing and Patient connected to nasal cannula oxygen  Post-op Assessment: Report given to PACU RN and Post -op Vital signs reviewed and stable  Post vital signs: Reviewed and stable  Complications: No apparent anesthesia complications

## 2017-01-09 NOTE — Op Note (Signed)
NAME:  [name], [name]                    ACCOUNT NO.:  MEDICAL RECORD NO.:  [id number]  LOCATION:                                 FACILITY:  PHYSICIAN:  Excell SeltzerJohn J. Annabell HowellsWrenn, M.D.    DATE OF BIRTH:  05-Mar-1981  DATE OF PROCEDURE:  01/09/2017 DATE OF DISCHARGE:                              OPERATIVE REPORT   PROCEDURE: 1. Cystoscopy with removal of left double-J stent. 2. Left ureteroscopy with holmium lasertripsy and stone extraction.  PREOPERATIVE DIAGNOSIS:  Left distal ureteral stone.  POSTOPERATIVE DIAGNOSIS:  Left distal ureteral stone.  SURGEON:  Excell SeltzerJohn J. Annabell HowellsWrenn, M.D.  ANESTHESIA:  General.  SPECIMEN:  Stone fragments.  DRAINS:  None.  BLOOD LOSS:  None.  COMPLICATIONS:  None.  INDICATIONS:  Ms. [name] is a 36 year old white female with a history of a 10 mm left distal ureteral stone, who originally presented with infection and was stented.  She returns now for ureteroscopy.  FINDINGS AND PROCEDURE:  She was taken to the operating room where general anesthetic was induced.  She was given Cipro.  She was placed in lithotomy position and fitted with PAS hose.  Her perineum and genitalia were prepped with Betadine solution, and she was draped in usual sterile fashion.  Cystoscopy was performed using a 23-French scope and 30-degree lens. The stone was identified at the left ureteral orifice where there was some edema.  The stent was removed to the urethral meatus and a guidewire was passed to the kidney under fluoroscopic guidance.  The stent was removed and a 6.5-French semi-rigid ureteroscope was passed alongside the wire.  The stone was visualized.  It was then engaged with a 365 micron laser fiber set on 1 watt and 10 hertz initially, but that was increased to 45 hertz based on fragmentation.  The stone dusted more than fragmented initially, but then broke into manageable fragments which were then removed with the NGage basket.  After all stone fragments were removed,  inspection revealed no residual fragments and a widely patent ureter.  It was felt replacement stent was not indicated.  The cystoscope was then reinserted into the bladder.  Some residual stone material was removed from the bladder.  The scope was removed. The wire was removed.  The patient was taken down from lithotomy position.  Her anesthetic was reversed.  She was moved to the recovery room in stable condition.  There were no complications.  She will be given the stones to bring to the office for analysis.     Excell SeltzerJohn J. Annabell HowellsWrenn, M.D.     JJW/MEDQ  D:  01/09/2017  T:  01/09/2017  Job:  [id number]

## 2017-01-09 NOTE — Anesthesia Preprocedure Evaluation (Addendum)
Anesthesia Evaluation  Patient identified by MRN, date of birth, ID band Patient awake    Reviewed: Allergy & Precautions, NPO status   Airway Mallampati: I  TM Distance: >3 FB Neck ROM: Full    Dental   Pulmonary asthma ,    breath sounds clear to auscultation       Cardiovascular hypertension,  Rhythm:Regular Rate:Normal     Neuro/Psych    GI/Hepatic   Endo/Other    Renal/GU stones     Musculoskeletal   Abdominal   Peds  Hematology  (+) Blood dyscrasia, , factor 5 Leiden   Anesthesia Other Findings   Reproductive/Obstetrics                            Anesthesia Physical Anesthesia Plan  ASA: II  Anesthesia Plan: General   Post-op Pain Management:    Induction: Intravenous  PONV Risk Score and Plan: 4 or greater and Ondansetron, Dexamethasone, Midazolam, Treatment may vary due to age or medical condition and Scopolamine patch - Pre-op  Airway Management Planned: LMA  Additional Equipment:   Intra-op Plan:   Post-operative Plan:   Informed Consent: I have reviewed the patients History and Physical, chart, labs and discussed the procedure including the risks, benefits and alternatives for the proposed anesthesia with the patient or authorized representative who has indicated his/her understanding and acceptance.   Dental advisory given  Plan Discussed with: CRNA  Anesthesia Plan Comments:         Anesthesia Quick Evaluation

## 2017-01-10 ENCOUNTER — Encounter (HOSPITAL_BASED_OUTPATIENT_CLINIC_OR_DEPARTMENT_OTHER): Payer: Self-pay | Admitting: Urology

## 2017-08-14 ENCOUNTER — Encounter: Payer: Self-pay | Admitting: Physician Assistant

## 2019-01-23 IMAGING — CT CT RENAL STONE PROTOCOL
2 of 3 series · 16 of 46 positions shown, 18 images · non-contrast
Comparison: 12/21/2016 and 09/28/2013

CLINICAL DATA: Left flank pain and fever. History of renal calculi
with recent study demonstrating distal ureteral calculi.

EXAM:
CT ABDOMEN AND PELVIS WITHOUT CONTRAST
TECHNIQUE: Multidetector CT imaging of the abdomen and pelvis was performed
following the standard protocol without IV contrast.

[Series 3: lung · axial · 0.86mm/px · z∈[-80,-4]mm · 13 of 44 slices shown, 15 images]
[im 3/44  soft-tissue]
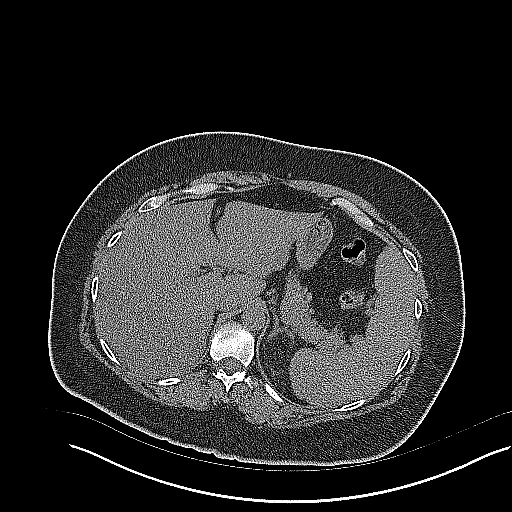
[im 3/44  bone]
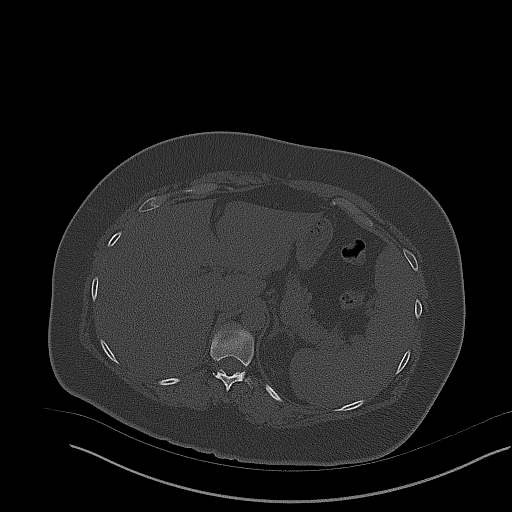
[im 6/44  soft-tissue]
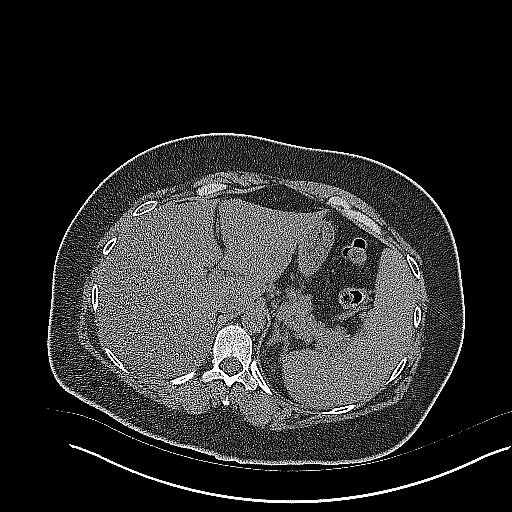
[im 9/44  soft-tissue]
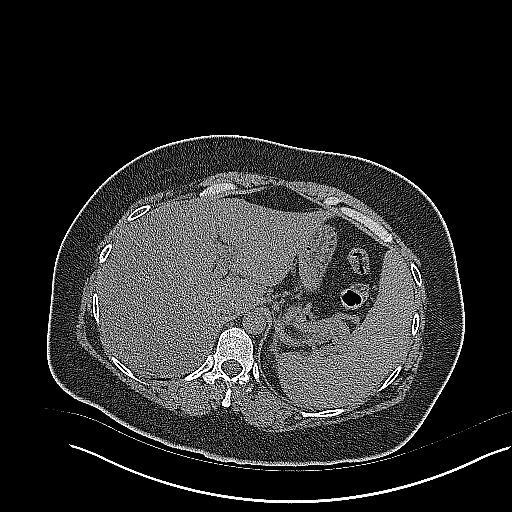
[im 13/44  soft-tissue]
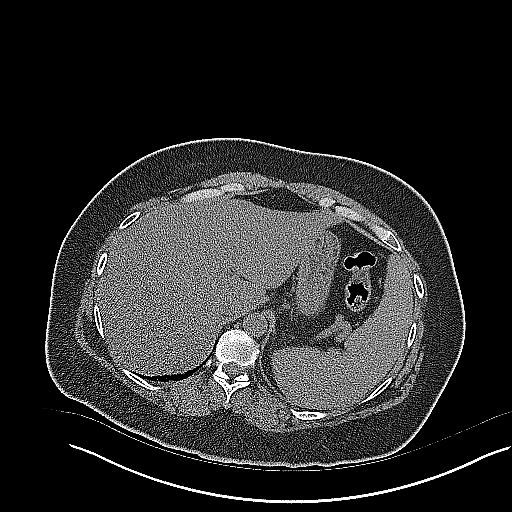
[im 16/44  soft-tissue]
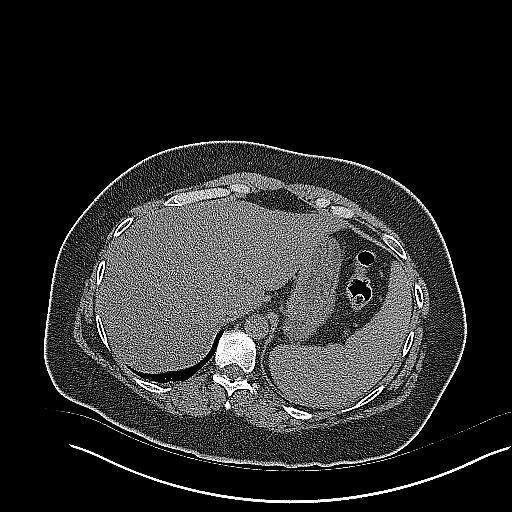
[im 19/44  soft-tissue]
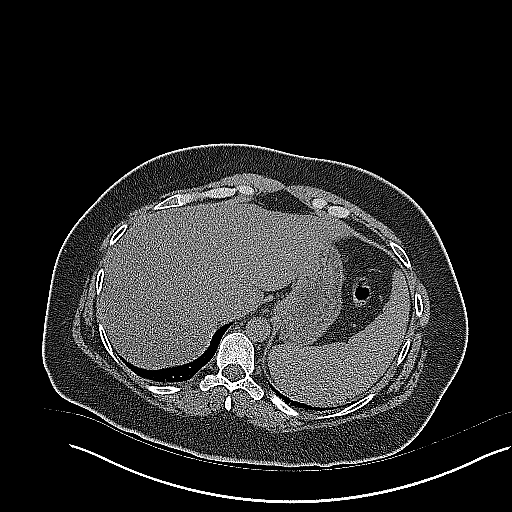
[im 23/44  soft-tissue]
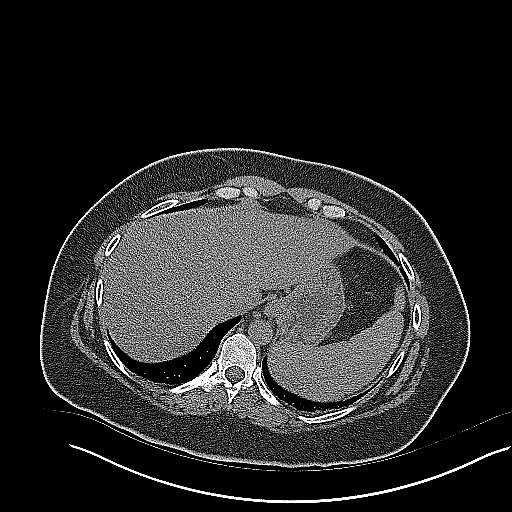
[im 25/44  soft-tissue]
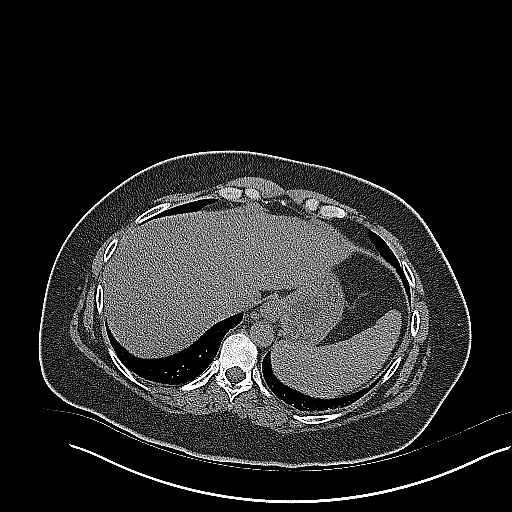
[im 28/44  soft-tissue]
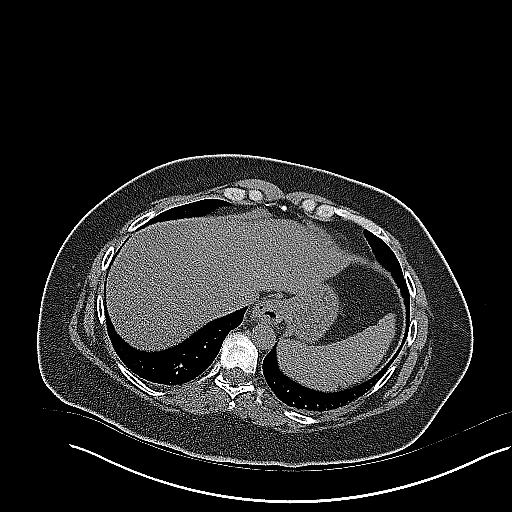
[im 28/44  bone]
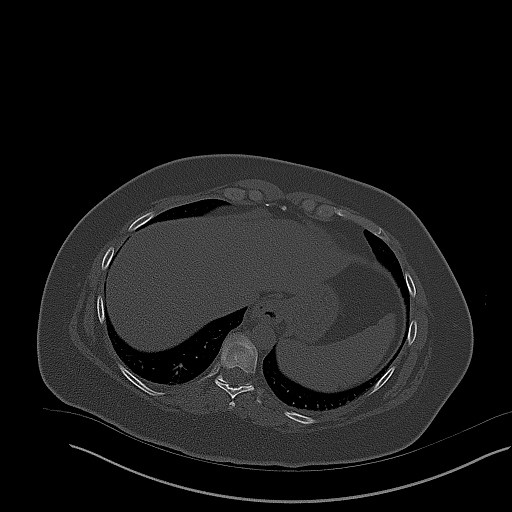
[im 31/44  soft-tissue]
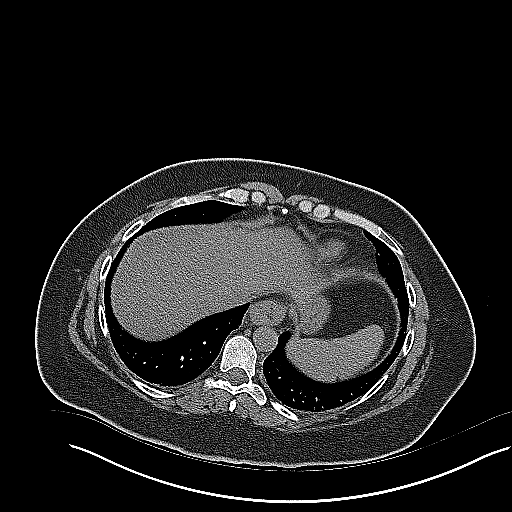
[im 35/44  soft-tissue]
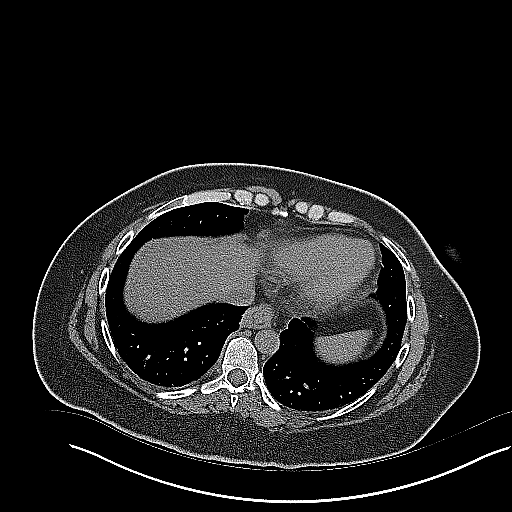
[im 38/44  soft-tissue]
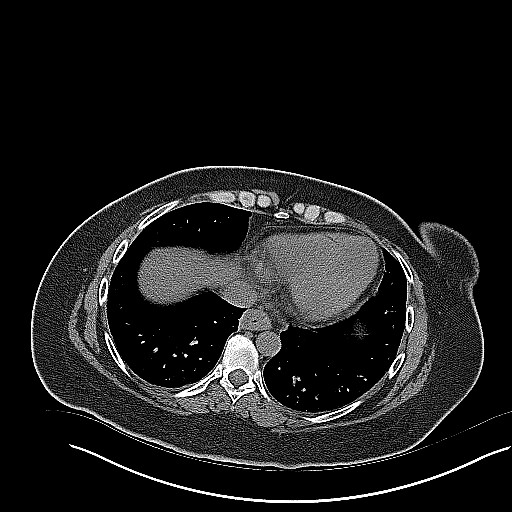
[im 41/44  soft-tissue]
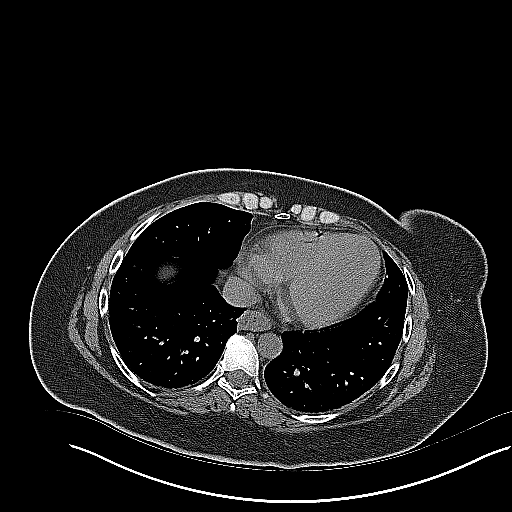

[Series 4: coronal · coronal · 0.74mm/px · 3 of 141 slices shown]
[im 47/141  soft-tissue]
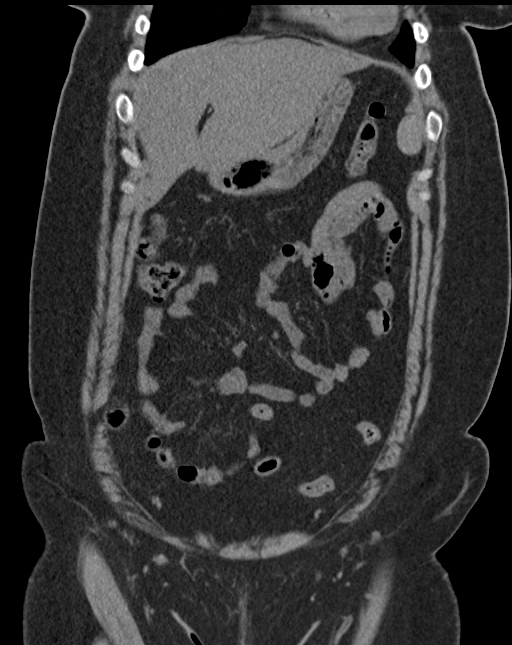
[im 63/141  soft-tissue]
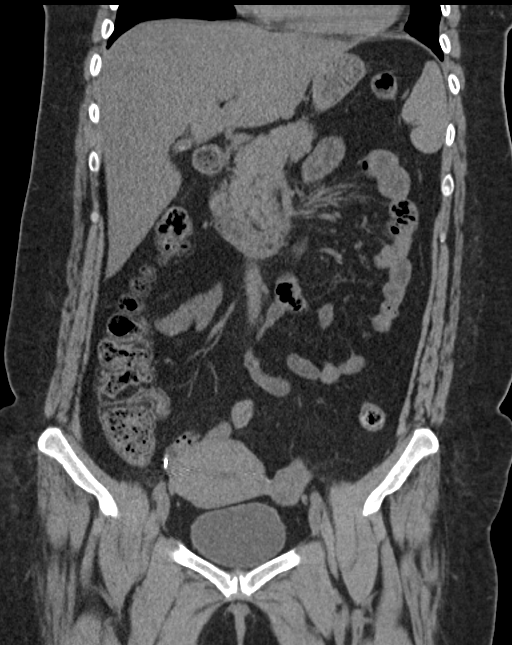
[im 78/141  soft-tissue]
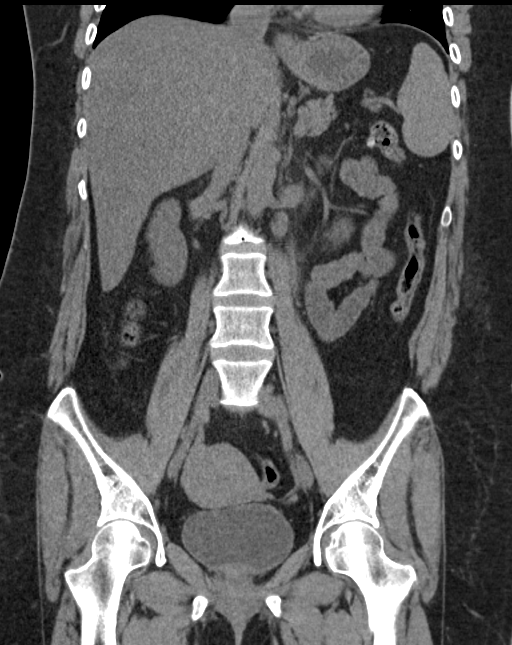

[16 of 46 positions shown; findings below may reference images not displayed]

FINDINGS: Lower chest: No acute abnormality.

Hepatobiliary: Unenhanced appearance of the liver is unremarkable.
There are some calcified gallstones within a contracted gallbladder.
No biliary ductal dilatation identified.

Pancreas: Unremarkable. No pancreatic ductal dilatation or
surrounding inflammatory changes.

Spleen: Normal in size without focal abnormality.

Adrenals/Urinary Tract: Multiple small bilateral nonobstructing
renal calculi are again noted scattered throughout both renal
collecting systems. No discernible hydronephrosis is present
bilaterally. In the distal left ureter, there are 2 adjacent calculi
present unchanged in appearance and position since the prior study.
A larger 10 mm calculus is present and a smaller more superior 5 mm
calculus. No bladder calculi identified.

Stomach/Bowel: Bowel is unremarkable without evidence of obstruction
or inflammation. No free air.

Vascular/Lymphatic: No enlarged lymph nodes are seen. No vascular
abnormalities.

Reproductive: Uterus and bilateral adnexa are unremarkable. Status
post bilateral tubal ligation.

Other: No abdominal wall hernia or abnormality. No abdominopelvic
ascites.

Musculoskeletal: No acute or significant osseous findings.
IMPRESSION: No change in positioning or appearance of 2 adjacent calculi within
the distal left ureter with a larger 10 mm calculus and adjacent
smaller superior 5 mm calculus. These are not causing any
discernible left-sided hydronephrosis. Otherwise stable multiple
small bilateral nonobstructing calculi scattered in both renal
collecting systems.
